# Patient Record
Sex: Male | Born: 1980 | Race: White | Hispanic: No | Marital: Married | Smoking: Never smoker
Health system: Southern US, Community
[De-identification: ages and names within clinical notes are randomized; demographics above are authoritative.]

## PROBLEM LIST (undated history)

## (undated) DIAGNOSIS — I10 Essential (primary) hypertension: Secondary | ICD-10-CM

## (undated) DIAGNOSIS — J45909 Unspecified asthma, uncomplicated: Secondary | ICD-10-CM

## (undated) DIAGNOSIS — F419 Anxiety disorder, unspecified: Secondary | ICD-10-CM

## (undated) DIAGNOSIS — L509 Urticaria, unspecified: Secondary | ICD-10-CM

## (undated) DIAGNOSIS — G35 Multiple sclerosis: Secondary | ICD-10-CM

## (undated) DIAGNOSIS — K219 Gastro-esophageal reflux disease without esophagitis: Secondary | ICD-10-CM

## (undated) DIAGNOSIS — G4730 Sleep apnea, unspecified: Secondary | ICD-10-CM

## (undated) DIAGNOSIS — F32A Depression, unspecified: Secondary | ICD-10-CM

## (undated) HISTORY — DX: Urticaria, unspecified: L50.9

## (undated) HISTORY — PX: OTHER SURGICAL HISTORY: SHX169

## (undated) HISTORY — PX: KNEE SURGERY: SHX244

---

## 2002-02-09 ENCOUNTER — Inpatient Hospital Stay (HOSPITAL_COMMUNITY): Admission: EM | Admit: 2002-02-09 | Discharge: 2002-02-11 | Payer: Self-pay | Admitting: Emergency Medicine

## 2002-02-09 ENCOUNTER — Encounter: Payer: Self-pay | Admitting: General Surgery

## 2002-02-09 ENCOUNTER — Encounter: Payer: Self-pay | Admitting: Emergency Medicine

## 2002-06-05 ENCOUNTER — Emergency Department (HOSPITAL_COMMUNITY): Admission: EM | Admit: 2002-06-05 | Discharge: 2002-06-05 | Payer: Self-pay | Admitting: Emergency Medicine

## 2002-06-05 ENCOUNTER — Encounter: Payer: Self-pay | Admitting: Emergency Medicine

## 2011-04-25 DIAGNOSIS — R059 Cough, unspecified: Secondary | ICD-10-CM | POA: Insufficient documentation

## 2011-04-25 DIAGNOSIS — R0602 Shortness of breath: Secondary | ICD-10-CM | POA: Insufficient documentation

## 2011-04-25 DIAGNOSIS — J3489 Other specified disorders of nose and nasal sinuses: Secondary | ICD-10-CM | POA: Insufficient documentation

## 2011-04-25 DIAGNOSIS — R05 Cough: Secondary | ICD-10-CM | POA: Insufficient documentation

## 2011-04-25 DIAGNOSIS — R071 Chest pain on breathing: Secondary | ICD-10-CM | POA: Insufficient documentation

## 2011-04-25 DIAGNOSIS — Z7982 Long term (current) use of aspirin: Secondary | ICD-10-CM | POA: Insufficient documentation

## 2011-04-25 NOTE — ED Notes (Signed)
Pt c/o pain in left chest, sharp at times, worse with deep breath at times, onset x 1 week, worse recently.

## 2011-04-26 ENCOUNTER — Encounter (HOSPITAL_COMMUNITY): Payer: Self-pay

## 2011-04-26 ENCOUNTER — Other Ambulatory Visit: Payer: Self-pay

## 2011-04-26 ENCOUNTER — Emergency Department (HOSPITAL_COMMUNITY)
Admission: EM | Admit: 2011-04-26 | Discharge: 2011-04-26 | Disposition: A | Discharge status: Home / Self Care | Attending: Emergency Medicine | Admitting: Emergency Medicine

## 2011-04-26 ENCOUNTER — Emergency Department (HOSPITAL_COMMUNITY)

## 2011-04-26 DIAGNOSIS — J069 Acute upper respiratory infection, unspecified: Secondary | ICD-10-CM

## 2011-04-26 DIAGNOSIS — R0789 Other chest pain: Secondary | ICD-10-CM

## 2011-04-26 MED ORDER — IBUPROFEN 800 MG PO TABS
800.0000 mg | ORAL_TABLET | Freq: Once | ORAL | Status: AC
  Administered 2011-04-26: 800 mg via ORAL
  Filled 2011-04-26: qty 1

## 2011-04-26 MED ORDER — ACETAMINOPHEN-CODEINE #3 300-30 MG PO TABS
1.0000 | ORAL_TABLET | Freq: Once | ORAL | Status: AC
  Administered 2011-04-26: 1 via ORAL
  Filled 2011-04-26: qty 1

## 2011-04-26 MED ORDER — IBUPROFEN 800 MG PO TABS: 800.0000 mg | ORAL_TABLET | Freq: Three times a day (TID) | ORAL | Status: AC

## 2011-04-26 MED ORDER — ACETAMINOPHEN-CODEINE 300-60 MG PO TABS: 1.0000 | ORAL_TABLET | Freq: Four times a day (QID) | ORAL | Status: AC | PRN

## 2011-04-26 NOTE — Discharge Instructions (Signed)
Upper Respiratory Infection, Adult  An upper respiratory infection (URI) is also sometimes known as the common cold. The upper respiratory tract includes the nose, sinuses, throat, trachea, and bronchi. Bronchi are the airways leading to the lungs. Most people improve within 1 week, but symptoms can last up to 2 weeks. A residual cough may last even longer.   CAUSES  Many different viruses can infect the tissues lining the upper respiratory tract. The tissues become irritated and inflamed and often become very moist. Mucus production is also common. A cold is contagious. You can easily spread the virus to others by oral contact. This includes kissing, sharing a glass, coughing, or sneezing. Touching your mouth or nose and then touching a surface, which is then touched by another person, can also spread the virus.  SYMPTOMS   Symptoms typically develop 1 to 3 days after you come in contact with a cold virus. Symptoms vary from person to person. They may include:  · Runny nose.  · Sneezing.  · Nasal congestion.  · Sinus irritation.  · Sore throat.  · Loss of voice (laryngitis).  · Cough.  · Fatigue.  · Muscle aches.  · Loss of appetite.  · Headache.  · Low-grade fever.  DIAGNOSIS   You might diagnose your own cold based on familiar symptoms, since most people get a cold 2 to 3 times a year. Your caregiver can confirm this based on your exam. Most importantly, your caregiver can check that your symptoms are not due to another disease such as strep throat, sinusitis, pneumonia, asthma, or epiglottitis. Blood tests, throat tests, and X-rays are not necessary to diagnose a common cold, but they may sometimes be helpful in excluding other more serious diseases. Your caregiver will decide if any further tests are required.  RISKS AND COMPLICATIONS   You may be at risk for a more severe case of the common cold if you smoke cigarettes, have chronic heart disease (such as heart failure) or lung disease (such as asthma), or if  you have a weakened immune system. The very young and very old are also at risk for more serious infections. Bacterial sinusitis, middle ear infections, and bacterial pneumonia can complicate the common cold. The common cold can worsen asthma and chronic obstructive pulmonary disease (COPD). Sometimes, these complications can require emergency medical care and may be life-threatening.  PREVENTION   The best way to protect against getting a cold is to practice good hygiene. Avoid oral or hand contact with people with cold symptoms. Wash your hands often if contact occurs. There is no clear evidence that vitamin C, vitamin E, echinacea, or exercise reduces the chance of developing a cold. However, it is always recommended to get plenty of rest and practice good nutrition.  TREATMENT   Treatment is directed at relieving symptoms. There is no cure. Antibiotics are not effective, because the infection is caused by a virus, not by bacteria. Treatment may include:  · Increased fluid intake. Sports drinks offer valuable electrolytes, sugars, and fluids.  · Breathing heated mist or steam (vaporizer or shower).  · Eating chicken soup or other clear broths, and maintaining good nutrition.  · Getting plenty of rest.  · Using gargles or lozenges for comfort.  · Controlling fevers with ibuprofen or acetaminophen as directed by your caregiver.  · Increasing usage of your inhaler if you have asthma.  Zinc gel and zinc lozenges, taken in the first 24 hours of the common cold, can shorten the   duration and lessen the severity of symptoms. Pain medicines may help with fever, muscle aches, and throat pain. A variety of non-prescription medicines are available to treat congestion and runny nose. Your caregiver can make recommendations and may suggest nasal or lung inhalers for other symptoms.   HOME CARE INSTRUCTIONS   · Only take over-the-counter or prescription medicines for pain, discomfort, or fever as directed by your  caregiver.  · Use a warm mist humidifier or inhale steam from a shower to increase air moisture. This may keep secretions moist and make it easier to breathe.  · Drink enough water and fluids to keep your urine clear or pale yellow.  · Rest as needed.  · Return to work when your temperature has returned to normal or as your caregiver advises. You may need to stay home longer to avoid infecting others. You can also use a face mask and careful hand washing to prevent spread of the virus.  SEEK MEDICAL CARE IF:   · After the first few days, you feel you are getting worse rather than better.  · You need your caregiver's advice about medicines to control symptoms.  · You develop chills, worsening shortness of breath, or brown or red sputum. These may be signs of pneumonia.  · You develop yellow or brown nasal discharge or pain in the face, especially when you bend forward. These may be signs of sinusitis.  · You develop a fever, swollen neck glands, pain with swallowing, or white areas in the back of your throat. These may be signs of strep throat.  SEEK IMMEDIATE MEDICAL CARE IF:   · You have a fever.  · You develop severe or persistent headache, ear pain, sinus pain, or chest pain.  · You develop wheezing, a prolonged cough, cough up blood, or have a change in your usual mucus (if you have chronic lung disease).  · You develop sore muscles or a stiff neck.

## 2011-04-26 NOTE — ED Provider Notes (Signed)
History     CSN: [id number]  Arrival date & time 04/25/11  2341   First MD Initiated Contact with Patient 04/26/11 0008      Chief Complaint  Patient presents with  . Shortness of Breath  . Chest Pain    (Consider location/radiation/quality/duration/timing/severity/associated sxs/prior treatment) The history is provided by the patient.  patient is sick for the last week with cough cold and congestion. Cough is keeping him up at night and he has developed green productive sputum and today with what he believes his tinges of blood stained sputum. Since yesterday he has had left-sided chest pain worse with cough and worse with movement. Pain is sharp in quality and not radiating. No alleviating factors. He is having difficulty breathing with increased nasal congestion. He has been exposed to sick contacts with similar symptoms. No fevers. No nausea vomiting. No abdominal pain. Her leg pain or leg swelling. No medical problems or history of DVT or PE. pain moderate to severe. No history of same.  History reviewed. No pertinent past medical history.  History reviewed. No pertinent past surgical history.  No family history on file.  History  Substance Use Topics  . Smoking status: Not on file  . Smokeless tobacco: Current User  . Alcohol Use: Yes      Review of Systems  Constitutional: Negative for fever and chills.  HENT: Positive for congestion and sinus pressure. Negative for sore throat, neck pain, neck stiffness and voice change.   Eyes: Negative for pain.  Respiratory: Positive for cough.   Cardiovascular: Positive for chest pain. Negative for leg swelling.  Gastrointestinal: Negative for abdominal pain.  Genitourinary: Negative for dysuria.  Musculoskeletal: Negative for back pain.  Skin: Negative for rash.  Neurological: Negative for headaches.  All other systems reviewed and are negative.    Allergies  Penicillins  Home Medications   Current Outpatient Rx    Name Route Sig Dispense Refill  . ASPIRIN 325 MG PO TABS Oral Take 325 mg by mouth daily.      BP 136/57  Pulse 68  Temp(Src) 97.8 F (36.6 C) (Oral)  Resp 18  Ht 6' 3.5" (1.918 m)  Wt 258 lb (117.028 kg)  BMI 31.82 kg/m2  SpO2 100%  Physical Exam  Constitutional: He is oriented to person, place, and time. He appears well-developed and well-nourished.  HENT:  Head: Normocephalic and atraumatic.  Eyes: Conjunctivae and EOM are normal. Pupils are equal, round, and reactive to light.  Neck: Trachea normal. Neck supple. No thyromegaly present.  Cardiovascular: Normal rate, regular rhythm, S1 normal, S2 normal and normal pulses.     No systolic murmur is present   No diastolic murmur is present  Pulses:      Radial pulses are 2+ on the right side, and 2+ on the left side.  Pulmonary/Chest: Effort normal and breath sounds normal. He has no wheezes. He has no rhonchi. He has no rales.       Reproducible left anterior chest wall tenderness. No crepitus. No erythema or fluctuance.  Abdominal: Soft. Normal appearance and bowel sounds are normal. There is no tenderness. There is no CVA tenderness and negative Murphy's sign.  Musculoskeletal:       BLE:s Calves nontender, no cords or erythema, negative Homans sign  Neurological: He is alert and oriented to person, place, and time. He has normal strength. No cranial nerve deficit or sensory deficit. GCS eye subscore is 4. GCS verbal subscore is 5. GCS motor subscore  is 6.  Skin: Skin is warm and dry. No rash noted. He is not diaphoretic.  Psychiatric: His speech is normal.       Cooperative and appropriate    ED Course  Procedures (including critical care time)  Labs Reviewed - No data to display Dg Chest 2 View  04/26/2011  *RADIOLOGY REPORT*  Clinical Data: Cough, congestion, shortness of breath.  CHEST - 2 VIEW  Comparison: None.  Findings: No focal consolidation, pleural effusion, or pneumothorax.  Cardiomediastinal contours are  within normal limits. No acute osseous abnormality.  IMPRESSION: No acute process identified.  Original Report Authenticated By: Waneta Martins, M.D.    Date: 04/26/2011  Rate: 66  Rhythm: normal sinus rhythm  QRS Axis: normal  Intervals: normal  ST/T Wave abnormalities: nonspecific ST changes  Conduction Disutrbances:none  Narrative Interpretation:   Old EKG Reviewed: none available  Motrin. Tylenol with codeine provided   MDM   Clinical presentation suggest upper respiratory infection with associated reproducible anterior chest wall pain, worsened by cough. Chest x-ray reviewed without pneumonia or pneumothorax. Reliable historian verbalizes understanding chest pain and infection precautions. He does not have a local primary care physician and agrees to recheck in 48 hours for any persistent or worsening condition. No clinical PE and no DVT symptoms. Stable for discharge home at this time        Sunnie Nielsen, MD 04/26/11 0140

## 2011-04-29 ENCOUNTER — Emergency Department (HOSPITAL_COMMUNITY)
Admission: EM | Admit: 2011-04-29 | Discharge: 2011-04-29 | Disposition: A | Discharge status: Home / Self Care | Attending: Emergency Medicine | Admitting: Emergency Medicine

## 2011-04-29 ENCOUNTER — Encounter (HOSPITAL_COMMUNITY): Payer: Self-pay | Admitting: *Deleted

## 2011-04-29 ENCOUNTER — Emergency Department (HOSPITAL_COMMUNITY)

## 2011-04-29 DIAGNOSIS — K2970 Gastritis, unspecified, without bleeding: Secondary | ICD-10-CM

## 2011-04-29 DIAGNOSIS — R109 Unspecified abdominal pain: Secondary | ICD-10-CM | POA: Insufficient documentation

## 2011-04-29 LAB — URINALYSIS, ROUTINE W REFLEX MICROSCOPIC
Leukocytes, UA: NEGATIVE
Nitrite: NEGATIVE
Specific Gravity, Urine: 1.015 (ref 1.005–1.030)
Urobilinogen, UA: 0.2 mg/dL (ref 0.0–1.0)
pH: 7.5 (ref 5.0–8.0)

## 2011-04-29 LAB — CBC
HCT: 42.4 % (ref 39.0–52.0)
Platelets: 148 10*3/uL — ABNORMAL LOW (ref 150–400)
RDW: 12.2 % (ref 11.5–15.5)
WBC: 5.4 10*3/uL (ref 4.0–10.5)

## 2011-04-29 LAB — DIFFERENTIAL
Basophils Absolute: 0 10*3/uL (ref 0.0–0.1)
Lymphocytes Relative: 28 % (ref 12–46)
Neutro Abs: 3.1 10*3/uL (ref 1.7–7.7)
Neutrophils Relative %: 56 % (ref 43–77)

## 2011-04-29 LAB — COMPREHENSIVE METABOLIC PANEL
AST: 35 U/L (ref 0–37)
Albumin: 3.9 g/dL (ref 3.5–5.2)
Alkaline Phosphatase: 65 U/L (ref 39–117)
Chloride: 102 mEq/L (ref 96–112)
Creatinine, Ser: 0.91 mg/dL (ref 0.50–1.35)
Potassium: 4.3 mEq/L (ref 3.5–5.1)
Total Bilirubin: 0.5 mg/dL (ref 0.3–1.2)
Total Protein: 7.2 g/dL (ref 6.0–8.3)

## 2011-04-29 MED ORDER — OMEPRAZOLE 20 MG PO CPDR: 20.0000 mg | DELAYED_RELEASE_CAPSULE | Freq: Two times a day (BID) | ORAL | Status: AC

## 2011-04-29 NOTE — ED Notes (Signed)
Epigastric pain, and pain rt flank.  No nausea, no vomiting, no diarrhea.  Recent uti and uri.

## 2011-04-29 NOTE — ED Provider Notes (Signed)
History    Scribed for Troy Lyons, MD, the patient was seen in room [facility]18/[facility]18. This chart was scribed by Katha Cabal.   CSN: [id number]  Arrival date & time 04/29/11  1032   First MD Initiated Contact with Patient 04/29/11 1141      Chief Complaint  Patient presents with  . Flank Pain    (Consider location/radiation/quality/duration/timing/severity/associated sxs/prior Treatment) Pt was seen at 11:45 AM   Patient is a 31 y.o. male presenting with abdominal pain. The history is provided by the patient. No language interpreter was used.  Abdominal Pain The primary symptoms of the illness do not include nausea, vomiting, diarrhea or dysuria. Episode onset: 3 days ago  The problem has not changed since onset. The illness is associated with eating. Additional symptoms associated with the illness include heartburn. Associated symptoms comments: Chills, shortness of breath. Significant associated medical issues do not include GERD.    Patient reports decreased urinary output.  Reports 4 weeks ago he had UTI that resolved after taking antibiotics.  Patient seen in ED on 04/26/11 for chest pain was given prescription pain medication.  Pain medication provides moderate relief of abdominal and flank pain.  No history of chronic health problems.  No abdominal surgical history.     History reviewed. No pertinent past medical history.  Past Surgical History  Procedure Date  . Knee surgery     Family History  Problem Relation Age of Onset  . Heart failure Mother   . Heart failure Father     History  Substance Use Topics  . Smoking status: Never Smoker   . Smokeless tobacco: Current User    Types: Chew  . Alcohol Use: Yes      Review of Systems  Gastrointestinal: Positive for heartburn. Negative for nausea, vomiting and diarrhea.  Genitourinary: Negative for dysuria.  All other systems reviewed and are negative.  Remaining review of systems negative except as noted in the  HPI.    Allergies  Penicillins and Poison sumac extract  Home Medications   Current Outpatient Rx  Name Route Sig Dispense Refill  . ACETAMINOPHEN-CODEINE 300-60 MG PO TABS Oral Take 1 tablet by mouth every 6 (six) hours as needed for pain (Take at bedtime as needed for cough. No driving while medicated). 30 tablet 0  . IBUPROFEN 800 MG PO TABS Oral Take 1 tablet (800 mg total) by mouth 3 (three) times daily. 21 tablet 0    BP 144/74  Pulse 94  Temp(Src) 97.8 F (36.6 C) (Oral)  Resp 18  Ht 6' 3.5" (1.918 m)  Wt 262 lb (118.842 kg)  BMI 32.32 kg/m2  SpO2 97%  Physical Exam  Nursing note and vitals reviewed. Constitutional: He is oriented to person, place, and time. He appears well-developed and well-nourished.  HENT:  Head: Normocephalic and atraumatic.  Eyes: Conjunctivae and EOM are normal.  Neck: Neck supple.  Cardiovascular: Normal rate and regular rhythm.   No murmur heard. Pulmonary/Chest: Effort normal and breath sounds normal. No respiratory distress.  Abdominal: Soft. There is tenderness in the epigastric area. There is no rebound, no guarding and no CVA tenderness.  Musculoskeletal: Normal range of motion. He exhibits no edema.  Neurological: He is alert and oriented to person, place, and time. No sensory deficit. Coordination normal.  Skin: Skin is warm and dry. No rash noted.  Psychiatric: He has a normal mood and affect. His behavior is normal.    ED Course  Procedures (including critical care time)  DIAGNOSTIC STUDIES: Oxygen Saturation is 97% on room air, normal by my interpretation.     COORDINATION OF CARE: 11:51 AM  Physical exam complete.  Will order labs.     LABS / RADIOLOGY:   Labs Reviewed  CBC - Abnormal; Notable for the following:    Platelets 148 (*)    All other components within normal limits  DIFFERENTIAL - Abnormal; Notable for the following:    Eosinophils Relative 7 (*)    All other components within normal limits    COMPREHENSIVE METABOLIC PANEL - Abnormal; Notable for the following:    Glucose, Bld 101 (*)    ALT 65 (*)    All other components within normal limits  URINALYSIS, ROUTINE W REFLEX MICROSCOPIC  LIPASE, BLOOD   US Abdomen Complete  04/29/2011  *RADIOLOGY REPORT*  Clinical Data:  Right upper quadrant pain.  COMPLETE ABDOMINAL ULTRASOUND  Comparison:  None.  Findings:  Gallbladder:  No gallstones, gallbladder wall thickening, or pericholecystic fluid.  Common bile duct:   Within normal limits in caliber.  Liver:  No focal lesion identified.  Within normal limits in parenchymal echogenicity.  IVC:  Appears normal.  Pancreas:  Obscured by overlying bowel gas.  Spleen:  Within normal limits in size and echotexture.  Right Kidney:   Normal in size and parenchymal echogenicity.  No evidence of mass or hydronephrosis.  Left Kidney:  Normal in size and parenchymal echogenicity.  No evidence of mass or hydronephrosis.  Abdominal aorta:  No aneurysm identified.  IMPRESSION: Unremarkable abdominal ultrasound.  Original Report Authenticated By: Cyndie Chime, M.D.         MDM  The Ultrasound does not show a gallstone and the remainder of the workup looks okay.  I will recommend prilosec twice daily for the next two weeks and follow up with his pcp.  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  He has a physician there.       MEDICATIONS GIVEN IN THE E.D. Scheduled Meds:   Continuous Infusions:       IMPRESSION: No diagnosis found.   NEW MEDICATIONS: New Prescriptions   No medications on file      I personally performed the services described in this documentation, which was scribed in my presence. The recorded information has been reviewed and considered.        Troy Lyons, MD 04/29/11 1331

## 2011-04-29 NOTE — ED Notes (Signed)
Pt returned from ultrasound

## 2011-04-29 NOTE — Discharge Instructions (Signed)
Abdominal Pain  Abdominal pain can be caused by many things. Your caregiver decides the seriousness of your pain by an examination and possibly blood tests and X-rays. Many cases can be observed and treated at home. Most abdominal pain is not caused by a disease and will probably improve without treatment. However, in many cases, more time must pass before a clear cause of the pain can be found. Before that point, it may not be known if you need more testing, or if hospitalization or surgery is needed.  HOME CARE INSTRUCTIONS    Do not take laxatives unless directed by your caregiver.   Take pain medicine only as directed by your caregiver.   Only take over-the-counter or prescription medicines for pain, discomfort, or fever as directed by your caregiver.   Try a clear liquid diet (broth, tea, or water) for as long as directed by your caregiver. Slowly move to a bland diet as tolerated.  SEEK IMMEDIATE MEDICAL CARE IF:    The pain does not go away.   You have a fever.   You keep throwing up (vomiting).   The pain is felt only in portions of the abdomen. Pain in the right side could possibly be appendicitis. In an adult, pain in the left lower portion of the abdomen could be colitis or diverticulitis.   You pass bloody or black tarry stools.  MAKE SURE YOU:    Understand these instructions.   Will watch your condition.   Will get help right away if you are not doing well or get worse.  Document Released: 11/11/2004 Document Revised: 01/21/2011 Document Reviewed: 09/20/2007  Exit[employer] Patient Information 2012 [employer]    Gastritis  Gastritis is an inflammation (the body's way of reacting to injury and/or infection) of the stomach. It is often caused by viral or bacterial (germ) infections. It can also be caused by chemicals (including alcohol) and medications. This illness may be associated with generalized malaise (feeling tired, not well), cramps, and fever. The illness may last 2 to 7 days. If  symptoms of gastritis continue, gastroscopy (looking into the stomach with a telescope-like instrument), biopsy (taking tissue samples), and/or blood tests may be necessary to determine the cause. Antibiotics will not affect the illness unless there is a bacterial infection present. One common bacterial cause of gastritis is an organism known as H. Pylori. This can be treated with antibiotics. Other forms of gastritis are caused by too much acid in the stomach. They can be treated with medications such as H2 blockers and antacids. Home treatment is usually all that is needed. Young children will quickly become dehydrated (loss of body fluids) if vomiting and diarrhea are both present. Medications may be given to control nausea. Medications are usually not given for diarrhea unless especially bothersome. Some medications slow the removal of the virus from the gastrointestinal tract. This slows down the healing process.  HOME CARE INSTRUCTIONS  Home care instructions for nausea and vomiting:   For adults: drink small amounts of fluids often. Drink at least 2 quarts a day. Take sips frequently. Do not drink large amounts of fluid at one time. This may worsen the nausea.   Only take over-the-counter or prescription medicines for pain, discomfort, or fever as directed by your caregiver.   Drink clear liquids only. Those are anything you can see through such as water, broth, or soft drinks.   Once you are keeping clear liquids down, you may start full liquids, soups, juices, and ice   cream or sherbet. Slowly add bland (plain, not spicy) foods to your diet.  Home care instructions for diarrhea:   Diarrhea can be caused by bacterial infections or a virus. Your condition should improve with time, rest, fluids, and/or anti-diarrheal medication.   Until your diarrhea is under control, you should drink clear liquids often in small amounts. Clear liquids include: water, broth, jell-o water and weak  tea.  Avoid:   Milk.   Fruits.   Tobacco.   Alcohol.   Extremely hot or cold fluids.   Too much intake of anything at one time.  When your diarrhea stops you may add the following foods, which help the stool to become more formed:   Rice.   Bananas.   Apples without skin.   Dry toast.  Once these foods are tolerated you may add low-fat yogurt and low-fat cottage cheese. They will help to restore the normal bacterial balance in your bowel.  Wash your hands well to avoid spreading bacteria (germ) or virus.  SEEK IMMEDIATE MEDICAL CARE IF:    You are unable to keep fluids down.   Vomiting or diarrhea become persistent (constant).   Abdominal pain develops, increases, or localizes. (Right sided pain can be appendicitis. Left sided pain in adults can be diverticulitis.)   You develop a fever (an oral temperature above 102 F (38.9 C)).   Diarrhea becomes excessive or contains blood or mucus.   You have excessive weakness, dizziness, fainting or extreme thirst.   You are not improving or you are getting worse.   You have any other questions or concerns.  Document Released: 01/26/2001 Document Revised: 01/21/2011 Document Reviewed: 02/01/2005  [employer] Patient Information 2012 [employer]

## 2011-04-29 NOTE — ED Notes (Signed)
Reminded of need for urine specimen, pt verbalized understanding. Awaiting ultrasound

## 2012-08-02 ENCOUNTER — Encounter (HOSPITAL_COMMUNITY): Payer: Self-pay | Admitting: *Deleted

## 2012-08-02 ENCOUNTER — Emergency Department (HOSPITAL_COMMUNITY)

## 2012-08-02 ENCOUNTER — Emergency Department (HOSPITAL_COMMUNITY)
Admission: EM | Admit: 2012-08-02 | Discharge: 2012-08-03 | Disposition: A | Discharge status: Home / Self Care | Attending: Emergency Medicine | Admitting: Emergency Medicine

## 2012-08-02 DIAGNOSIS — Z880 Allergy status to penicillin: Secondary | ICD-10-CM | POA: Insufficient documentation

## 2012-08-02 DIAGNOSIS — Z8669 Personal history of other diseases of the nervous system and sense organs: Secondary | ICD-10-CM | POA: Insufficient documentation

## 2012-08-02 DIAGNOSIS — H9209 Otalgia, unspecified ear: Secondary | ICD-10-CM | POA: Insufficient documentation

## 2012-08-02 DIAGNOSIS — R221 Localized swelling, mass and lump, neck: Secondary | ICD-10-CM | POA: Insufficient documentation

## 2012-08-02 DIAGNOSIS — R51 Headache: Secondary | ICD-10-CM | POA: Insufficient documentation

## 2012-08-02 DIAGNOSIS — Z87820 Personal history of traumatic brain injury: Secondary | ICD-10-CM | POA: Insufficient documentation

## 2012-08-02 DIAGNOSIS — R220 Localized swelling, mass and lump, head: Secondary | ICD-10-CM | POA: Insufficient documentation

## 2012-08-02 DIAGNOSIS — H9203 Otalgia, bilateral: Secondary | ICD-10-CM

## 2012-08-02 DIAGNOSIS — R209 Unspecified disturbances of skin sensation: Secondary | ICD-10-CM | POA: Insufficient documentation

## 2012-08-02 HISTORY — DX: Multiple sclerosis: G35

## 2012-08-02 MED ORDER — OXYCODONE-ACETAMINOPHEN 5-325 MG PO TABS
1.0000 | ORAL_TABLET | Freq: Once | ORAL | Status: AC
  Administered 2012-08-02: 1 via ORAL
  Filled 2012-08-02: qty 1

## 2012-08-02 NOTE — ED Notes (Signed)
bil ear pain, sore throat, throat feels swollen "My whole head feels swollen"
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
## 2012-08-03 NOTE — ED Notes (Signed)
Patient left ambulatory with wife. Patient in good condition for discharge and discharge instructions given.

## 2012-08-30 ENCOUNTER — Emergency Department (HOSPITAL_COMMUNITY)
Admission: EM | Admit: 2012-08-30 | Discharge: 2012-08-31 | Disposition: A | Discharge status: Home / Self Care | Attending: Emergency Medicine | Admitting: Emergency Medicine

## 2012-08-30 ENCOUNTER — Encounter (HOSPITAL_COMMUNITY): Payer: Self-pay

## 2012-08-30 ENCOUNTER — Emergency Department (HOSPITAL_COMMUNITY)

## 2012-08-30 DIAGNOSIS — Z880 Allergy status to penicillin: Secondary | ICD-10-CM | POA: Insufficient documentation

## 2012-08-30 DIAGNOSIS — R110 Nausea: Secondary | ICD-10-CM | POA: Insufficient documentation

## 2012-08-30 DIAGNOSIS — R109 Unspecified abdominal pain: Secondary | ICD-10-CM | POA: Insufficient documentation

## 2012-08-30 DIAGNOSIS — M2550 Pain in unspecified joint: Secondary | ICD-10-CM | POA: Insufficient documentation

## 2012-08-30 DIAGNOSIS — K5900 Constipation, unspecified: Secondary | ICD-10-CM | POA: Insufficient documentation

## 2012-08-30 DIAGNOSIS — R61 Generalized hyperhidrosis: Secondary | ICD-10-CM | POA: Insufficient documentation

## 2012-08-30 DIAGNOSIS — Z8669 Personal history of other diseases of the nervous system and sense organs: Secondary | ICD-10-CM | POA: Insufficient documentation

## 2012-08-30 DIAGNOSIS — E860 Dehydration: Secondary | ICD-10-CM | POA: Insufficient documentation

## 2012-08-30 DIAGNOSIS — R300 Dysuria: Secondary | ICD-10-CM | POA: Insufficient documentation

## 2012-08-30 LAB — CBC WITH DIFFERENTIAL/PLATELET
Basophils Relative: 1 % (ref 0–1)
HCT: 39 % (ref 39.0–52.0)
Hemoglobin: 14 g/dL (ref 13.0–17.0)
Lymphocytes Relative: 40 % (ref 12–46)
Lymphs Abs: 2.6 10*3/uL (ref 0.7–4.0)
MCHC: 35.9 g/dL (ref 30.0–36.0)
Monocytes Absolute: 0.5 10*3/uL (ref 0.1–1.0)
Monocytes Relative: 8 % (ref 3–12)
Neutro Abs: 2.9 10*3/uL (ref 1.7–7.7)
Neutrophils Relative %: 45 % (ref 43–77)
RBC: 4.38 MIL/uL (ref 4.22–5.81)

## 2012-08-30 LAB — URINE MICROSCOPIC-ADD ON

## 2012-08-30 LAB — BASIC METABOLIC PANEL
BUN: 12 mg/dL (ref 6–23)
CO2: 26 mEq/L (ref 19–32)
Chloride: 107 mEq/L (ref 96–112)
Creatinine, Ser: 1.04 mg/dL (ref 0.50–1.35)
GFR calc Af Amer: >90 mL/min (ref >90)
Glucose, Bld: 103 mg/dL — ABNORMAL HIGH (ref 70–99)
Potassium: 3.9 mEq/L (ref 3.5–5.1)

## 2012-08-30 LAB — URINALYSIS, ROUTINE W REFLEX MICROSCOPIC
Glucose, UA: NEGATIVE mg/dL
Ketones, ur: NEGATIVE mg/dL
Leukocytes, UA: NEGATIVE
Nitrite: NEGATIVE
Specific Gravity, Urine: >1.03 — ABNORMAL HIGH (ref 1.005–1.030)
pH: 6 (ref 5.0–8.0)

## 2012-08-30 MED ORDER — OXYCODONE-ACETAMINOPHEN 5-325 MG PO TABS
1.0000 | ORAL_TABLET | Freq: Once | ORAL | Status: AC
  Administered 2012-08-30: 1 via ORAL
  Filled 2012-08-30: qty 1

## 2012-08-30 MED ORDER — LIDOCAINE HCL 2 % EX GEL
CUTANEOUS | Status: AC
  Administered 2012-08-30: 10 via URETHRAL
  Filled 2012-08-30: qty 10

## 2012-08-30 MED ORDER — LIDOCAINE HCL 2 % EX GEL
Freq: Once | CUTANEOUS | Status: AC
  Administered 2012-08-30: 10 via URETHRAL

## 2012-08-30 MED ORDER — ONDANSETRON 8 MG PO TBDP
8.0000 mg | ORAL_TABLET | Freq: Once | ORAL | Status: AC
  Administered 2012-08-30: 8 mg via ORAL
  Filled 2012-08-30: qty 1

## 2012-08-30 NOTE — ED Provider Notes (Addendum)
History    This chart was scribed for Dione Booze, MD by Donne Anon, ED Scribe. This patient was seen in room APA18/APA18 and the patient's care was started at 2308.  CSN: [id number] Arrival date & time 08/30/12  2214  First MD Initiated Contact with Patient 08/30/12 2308     Chief Complaint  Patient presents with  . Urinary Retention  . Flank Pain  . Abdominal Pain    The history is provided by the patient. No language interpreter was used.   HPI Comments: Troy Lyons is a 32 y.o. male with hx of MS, who presents to the Emergency Department complaining of gradual onset, gradually worsening left lower abdominal pain that radiates to his left flank, left hip pain, testicle pain and urinary retention (onset this morning). He has had these symptoms intermittently for the past year, with the last episode beginning 2 weeks ago and becoming worse and constant over the past 4 days. He rates his pain 7/10. Intercourse makes the pain worse, nothing makes the pain worse. He reports associated constipation, nausea, and diaphoresis. He has increased his liquid intake without relief of the urinary retention. He has tried ibuprofen with no relief. He occasionally takes Percocet for pain, but has not had any today. He has spoken with his PCP and MS doctor about these complaints, and they advised him to come to the ED today.   He denies fever, chills, and vomiting. His last MS flare-up was 2 months ago.     Past Medical History  Diagnosis Date  . MS (multiple sclerosis)    Past Surgical History  Procedure Laterality Date  . Knee surgery    . Nerve damage,    . Eye damage     Family History  Problem Relation Age of Onset  . Heart failure Mother   . Heart failure Father    History  Substance Use Topics  . Smoking status: Never Smoker   . Smokeless tobacco: Current User    Types: Chew  . Alcohol Use: No    Review of Systems  Constitutional: Positive for diaphoresis. Negative for  fever and chills.  Gastrointestinal: Positive for nausea, abdominal pain and constipation. Negative for vomiting.  Genitourinary: Positive for flank pain and difficulty urinating.  Musculoskeletal: Positive for arthralgias.  All other systems reviewed and are negative.    Allergies  Penicillins and Poison sumac extract  Home Medications  No current outpatient prescriptions on file.  BP 132/62  Pulse 74  Temp(Src) 98.2 F (36.8 C) (Oral)  Resp 24  Ht 6\' 3"  (1.905 m)  Wt 281 lb (127.461 kg)  BMI 35.12 kg/m2  SpO2 100%  Physical Exam  Nursing note and vitals reviewed. Constitutional: He appears well-developed and well-nourished. No distress.  HENT:  Head: Normocephalic and atraumatic.  Eyes: Conjunctivae are normal.  Neck: Neck supple. No tracheal deviation present.  Cardiovascular: Normal rate.   Pulmonary/Chest: Effort normal. No respiratory distress.  Abdominal: Soft. He exhibits no distension.  No CVA tenderness.    Genitourinary: Testes normal. Right testis shows no mass and no tenderness. Right testis is descended. Left testis shows no mass and no tenderness. Left testis is descended. Uncircumcised.  Bladder not distended.  Musculoskeletal: Normal range of motion.  Neurological: He is alert.  Skin: Skin is warm and dry.  Psychiatric: He has a normal mood and affect. His behavior is normal.    ED Course  Procedures (including critical care time) DIAGNOSTIC STUDIES: Oxygen Saturation is  100% on RA, normal by my interpretation.    COORDINATION OF CARE: 11:14 PM Discussed treatment plan which includes foley, urinalysis, Zofran, Percocet, cbc with differential, basic metabolic panel and abdominal CT scan with pt at bedside and pt agreed to plan.    Results for orders placed during the hospital encounter of 08/30/12  URINALYSIS, ROUTINE W REFLEX MICROSCOPIC      Result Value Range   Color, Urine YELLOW  YELLOW   APPearance CLEAR  CLEAR   Specific Gravity, Urine  >1.030 (*) 1.005 - 1.030   pH 6.0  5.0 - 8.0   Glucose, UA NEGATIVE  NEGATIVE mg/dL   Hgb urine dipstick SMALL (*) NEGATIVE   Bilirubin Urine NEGATIVE  NEGATIVE   Ketones, ur NEGATIVE  NEGATIVE mg/dL   Protein, ur NEGATIVE  NEGATIVE mg/dL   Urobilinogen, UA 0.2  0.0 - 1.0 mg/dL   Nitrite NEGATIVE  NEGATIVE   Leukocytes, UA NEGATIVE  NEGATIVE  CBC WITH DIFFERENTIAL      Result Value Range   WBC 6.5  4.0 - 10.5 K/uL   RBC 4.38  4.22 - 5.81 MIL/uL   Hemoglobin 14.0  13.0 - 17.0 g/dL   HCT 16.1  09.6 - 04.5 %   MCV 89.0  78.0 - 100.0 fL   MCH 32.0  26.0 - 34.0 pg   MCHC 35.9  30.0 - 36.0 g/dL   RDW 40.9  81.1 - 91.4 %   Platelets 170  150 - 400 K/uL   Neutrophils Relative % 45  43 - 77 %   Neutro Abs 2.9  1.7 - 7.7 K/uL   Lymphocytes Relative 40  12 - 46 %   Lymphs Abs 2.6  0.7 - 4.0 K/uL   Monocytes Relative 8  3 - 12 %   Monocytes Absolute 0.5  0.1 - 1.0 K/uL   Eosinophils Relative 7 (*) 0 - 5 %   Eosinophils Absolute 0.5  0.0 - 0.7 K/uL   Basophils Relative 1  0 - 1 %   Basophils Absolute 0.0  0.0 - 0.1 K/uL  BASIC METABOLIC PANEL      Result Value Range   Sodium 140  135 - 145 mEq/L   Potassium 3.9  3.5 - 5.1 mEq/L   Chloride 107  96 - 112 mEq/L   CO2 26  19 - 32 mEq/L   Glucose, Bld 103 (*) 70 - 99 mg/dL   BUN 12  6 - 23 mg/dL   Creatinine, Ser 7.82  0.50 - 1.35 mg/dL   Calcium 9.4  8.4 - 95.6 mg/dL   GFR calc non Af Amer >90  >90 mL/min   GFR calc Af Amer >90  >90 mL/min  URINE MICROSCOPIC-ADD ON      Result Value Range   Squamous Epithelial / LPF RARE  RARE   WBC, UA 0-2  <3 WBC/hpf   RBC / HPF 3-6  <3 RBC/hpf   Bacteria, UA FEW (*) RARE   Urine-Other MUCOUS PRESENT     Ct Abdomen Pelvis Wo Contrast  08/31/2012   *RADIOLOGY REPORT*  Clinical Data: Urinary retention and flank pain.  CT ABDOMEN AND PELVIS WITHOUT CONTRAST  Technique:  Multidetector CT imaging of the abdomen and pelvis was performed following the standard protocol without intravenous contrast.   Comparison: None.  Findings: Lung bases are clear.  No evidence for free air.  Normal appearance of the liver, gallbladder, spleen, stomach, pancreas and adrenal glands.  The urinary bladder is decompressed  with a Foley catheter.  No evidence for kidney stones or hydronephrosis.  No significant free fluid or lymphadenopathy.  No gross abnormality to the prostate or seminal vesicles.  Normal appearance of the appendix.  Small periumbilical hernia containing fat. No acute bony abnormality.  IMPRESSION: Negative for kidney stones or hydronephrosis. No acute abnormalities.   Original Report Authenticated By: Richarda Overlie, M.D.   Images viewed by me.  1. Abdominal pain   2. Dehydration     MDM  Abdominal and flank pain of uncertain cause. He has history of similar pain in the past with no clear diagnosis found. Decreased urine output. Foley catheter been placed by nursing and he only had about 50 mL of hearing in spite of not having gone all day. You'll be sent for CT scan to make sure he does not have ureteral calculi to account for his pain and oliguria. Screening labs have been ordered.  Laboratory workup is unremarkable including normal BUN and creatinine. However, urinalysis does show specific gravity greater than 1.030 indicating he has some degree of dehydration. This is in spite of his claim to have consumed a lot of fluids. He'll be given IV hydration and see if his urine output picks up.  After 1 L of IV fluid, he has put out about 150 mL of urine confirming that his main problem regarding urine output is dehydration and not any of obstruction. Foley catheter is removed and he is discharged. There is no clear explanation for his chronic abdominal pain.  I personally performed the services described in this documentation, which was scribed in my presence. The recorded information has been reviewed and is accurate.      Dione Booze, MD 08/31/12 0128  Dione Booze, MD 08/31/12 [phone]

## 2012-08-30 NOTE — ED Notes (Signed)
Started out in pain in my left hip, lower abdomen, and in my testicles per pt. Unable to urinate per pt. Last time I urinated was this morning per pt.

## 2012-08-30 NOTE — ED Notes (Signed)
Pt c/o lower abd pain, burning with urination, pain in testicle area that has progressively become worse over the past week, has not been able to urinate since this am per pt.

## 2012-08-30 NOTE — ED Notes (Signed)
Spoke with my PCP doctor yesterday and they said if it continued tomorrow that they would send me to the [facility], spoke with my MS doctor and they said to come here.

## 2012-08-31 MED ORDER — OXYCODONE-ACETAMINOPHEN 5-325 MG PO TABS
1.0000 | ORAL_TABLET | Freq: Once | ORAL | Status: AC
  Administered 2012-08-31: 1 via ORAL
  Filled 2012-08-31: qty 1

## 2012-08-31 MED ORDER — SODIUM CHLORIDE 0.9 % IV BOLUS (SEPSIS)
1000.0000 mL | Freq: Once | INTRAVENOUS | Status: AC
  Administered 2012-08-31: 1000 mL via INTRAVENOUS

## 2012-08-31 NOTE — ED Notes (Signed)
Dr Glick at bedside,  

## 2012-08-31 NOTE — ED Notes (Signed)
Pt states that the pain medication did not help, Dr. Preston Fleeting notified, additional orders given

## 2013-02-15 HISTORY — PX: SINOSCOPY: SHX187

## 2013-06-01 ENCOUNTER — Emergency Department (HOSPITAL_COMMUNITY)

## 2013-06-01 ENCOUNTER — Encounter (HOSPITAL_COMMUNITY): Payer: Self-pay | Admitting: Emergency Medicine

## 2013-06-01 ENCOUNTER — Emergency Department (HOSPITAL_COMMUNITY)
Admission: EM | Admit: 2013-06-01 | Discharge: 2013-06-01 | Disposition: A | Discharge status: Home / Self Care | Attending: Emergency Medicine | Admitting: Emergency Medicine

## 2013-06-01 ENCOUNTER — Other Ambulatory Visit: Payer: Self-pay

## 2013-06-01 DIAGNOSIS — Z880 Allergy status to penicillin: Secondary | ICD-10-CM | POA: Insufficient documentation

## 2013-06-01 DIAGNOSIS — J159 Unspecified bacterial pneumonia: Secondary | ICD-10-CM | POA: Insufficient documentation

## 2013-06-01 DIAGNOSIS — J189 Pneumonia, unspecified organism: Secondary | ICD-10-CM

## 2013-06-01 DIAGNOSIS — R1013 Epigastric pain: Secondary | ICD-10-CM | POA: Insufficient documentation

## 2013-06-01 DIAGNOSIS — M549 Dorsalgia, unspecified: Secondary | ICD-10-CM | POA: Insufficient documentation

## 2013-06-01 DIAGNOSIS — Z8669 Personal history of other diseases of the nervous system and sense organs: Secondary | ICD-10-CM | POA: Insufficient documentation

## 2013-06-01 LAB — BASIC METABOLIC PANEL
BUN: 16 mg/dL (ref 6–23)
CO2: 23 mEq/L (ref 19–32)
CREATININE: 0.95 mg/dL (ref 0.50–1.35)
Calcium: 9.8 mg/dL (ref 8.4–10.5)
Chloride: 108 mEq/L (ref 96–112)
Glucose, Bld: 98 mg/dL (ref 70–99)
POTASSIUM: 4.1 meq/L (ref 3.7–5.3)
Sodium: 144 mEq/L (ref 137–147)

## 2013-06-01 LAB — CBC
HEMATOCRIT: 41.6 % (ref 39.0–52.0)
Hemoglobin: 15.2 g/dL (ref 13.0–17.0)
MCH: 32.2 pg (ref 26.0–34.0)
MCHC: 36.5 g/dL — AB (ref 30.0–36.0)
MCV: 88.1 fL (ref 78.0–100.0)
Platelets: 163 10*3/uL (ref 150–400)
RBC: 4.72 MIL/uL (ref 4.22–5.81)
RDW: 12.6 % (ref 11.5–15.5)
WBC: 5 10*3/uL (ref 4.0–10.5)

## 2013-06-01 LAB — PRO B NATRIURETIC PEPTIDE: Pro B Natriuretic peptide (BNP): 14 pg/mL (ref 0–125)

## 2013-06-01 LAB — I-STAT TROPONIN, ED
Troponin i, poc: 0 ng/mL (ref 0.00–0.08)
Troponin i, poc: 0 ng/mL (ref 0.00–0.08)
Troponin i, poc: 0 ng/mL (ref 0.00–0.08)

## 2013-06-01 MED ORDER — HYDROMORPHONE HCL PF 1 MG/ML IJ SOLN
1.0000 mg | Freq: Once | INTRAMUSCULAR | Status: AC
  Administered 2013-06-01: 1 mg via INTRAVENOUS
  Filled 2013-06-01: qty 1

## 2013-06-01 MED ORDER — SODIUM CHLORIDE 0.9 % IV BOLUS (SEPSIS)
250.0000 mL | Freq: Once | INTRAVENOUS | Status: AC
  Administered 2013-06-01: 250 mL via INTRAVENOUS

## 2013-06-01 MED ORDER — LEVOFLOXACIN 750 MG PO TABS: 750.0000 mg | ORAL_TABLET | Freq: Every day | ORAL | Status: DC

## 2013-06-01 MED ORDER — HYDROCODONE-ACETAMINOPHEN 5-325 MG PO TABS: 1.0000 | ORAL_TABLET | Freq: Four times a day (QID) | ORAL | Status: DC | PRN

## 2013-06-01 MED ORDER — SODIUM CHLORIDE 0.9 % IV SOLN
INTRAVENOUS | Status: DC
  Administered 2013-06-01: 20:00:00 via INTRAVENOUS

## 2013-06-01 MED ORDER — IOHEXOL 350 MG/ML SOLN
100.0000 mL | Freq: Once | INTRAVENOUS | Status: AC | PRN
  Administered 2013-06-01: 100 mL via INTRAVENOUS

## 2013-06-01 MED ORDER — LEVOFLOXACIN IN D5W 750 MG/150ML IV SOLN
750.0000 mg | Freq: Once | INTRAVENOUS | Status: AC
  Administered 2013-06-01: 750 mg via INTRAVENOUS
  Filled 2013-06-01: qty 150

## 2013-06-01 MED ORDER — ONDANSETRON HCL 4 MG/2ML IJ SOLN
4.0000 mg | Freq: Once | INTRAMUSCULAR | Status: AC
  Administered 2013-06-01: 4 mg via INTRAVENOUS
  Filled 2013-06-01: qty 2

## 2013-06-01 NOTE — ED Notes (Signed)
Pt transported to Xray. 

## 2013-06-01 NOTE — Discharge Instructions (Signed)
Exam ice as directed. Use pain medicine as needed. [facility]. If the chest discomfort and pain does not resolve on the antibiotics will require additional workup. Return for any new or worse symptoms.

## 2013-06-01 NOTE — ED Provider Notes (Signed)
CSN: [id number]     Arrival date & time 06/01/13  1614 History   First MD Initiated Contact with Patient 06/01/13 1736     Chief Complaint  Patient presents with  . Chest Pain     (Consider location/radiation/quality/duration/timing/severity/associated sxs/prior Treatment) The history is provided by the patient.   33 year old male with six-day history of chest pain. Left-sided chest radiating to shoulder and arm and to left ear. Also goes to the back. Also associated with some epigastric tightness. Also with shortness of breath ongoing for 6 days. Patient was seen at the [facility] clinic 2 days ago had an EKG done showed incomplete right bundle branch block. Was told to go to the hospital at that time however he did not but decided to come in today. Today the pain got worse. Pain is intermittent and will sometimes they'll last for 1 hour. Currently is 8/10 current episode has lasted for 5 minutes. No fevers. Patient has a negative past medical history other than having MS. Patient sitting cardiac history with mom and dad with the myocardial infarction is being prior to age 21. Patient has never smoked. Patient was medically retired from [employer] for the [employer]. He does have [facility] benefits.  Past Medical History  Diagnosis Date  . MS (multiple sclerosis)    Past Surgical History  Procedure Laterality Date  . Knee surgery    . Nerve damage,    . Eye damage     Family History  Problem Relation Age of Onset  . Heart failure Mother   . Heart failure Father    History  Substance Use Topics  . Smoking status: Never Smoker   . Smokeless tobacco: Current User    Types: Chew  . Alcohol Use: No    Review of Systems  Constitutional: Negative for fever.  HENT: Negative for congestion.   Eyes: Negative for redness.  Respiratory: Positive for shortness of breath.   Cardiovascular: Positive for chest pain. Negative for leg swelling.  Gastrointestinal: Positive for abdominal pain. Negative for nausea  and vomiting.  Genitourinary: Negative for dysuria.  Musculoskeletal: Positive for back pain.  Skin: Negative for rash.  Neurological: Negative for headaches.  Hematological: Does not bruise/bleed easily.  Psychiatric/Behavioral: Negative for confusion.      Allergies  Penicillins and Poison sumac extract  Home Medications   Prior to Admission medications   Not on File   BP 139/90  Pulse 72  Resp 16  SpO2 98% Physical Exam  Nursing note and vitals reviewed. Constitutional: He is oriented to person, place, and time. He appears well-developed and well-nourished. No distress.  HENT:  Head: Normocephalic and atraumatic.  Mouth/Throat: Oropharynx is clear and moist.  Eyes: Conjunctivae and EOM are normal. Pupils are equal, round, and reactive to light.  Neck: Normal range of motion.  Cardiovascular: Normal rate, regular rhythm and normal heart sounds.   No murmur heard. Pulmonary/Chest: Effort normal and breath sounds normal.  Abdominal: Soft. Bowel sounds are normal. There is no tenderness.  Musculoskeletal: Normal range of motion. He exhibits no edema.  Neurological: He is alert and oriented to person, place, and time. No cranial nerve deficit. He exhibits normal muscle tone. Coordination normal.  Skin: Skin is warm. No rash noted.    ED Course  Procedures (including critical care time) Labs Review Labs Reviewed  CBC - Abnormal; Notable for the following:    MCHC 36.5 (*)    All other components within normal limits  BASIC METABOLIC PANEL  PRO B NATRIURETIC PEPTIDE  I-STAT TROPOININ, ED  I-STAT TROPOININ, ED  I-STAT TROPOININ, ED   Results for orders placed during the hospital encounter of 06/01/13  CBC      Result Value Ref Range   WBC 5.0  4.0 - 10.5 K/uL   RBC 4.72  4.22 - 5.81 MIL/uL   Hemoglobin 15.2  13.0 - 17.0 g/dL   HCT 16.1  09.6 - 04.5 %   MCV 88.1  78.0 - 100.0 fL   MCH 32.2  26.0 - 34.0 pg   MCHC 36.5 (*) 30.0 - 36.0 g/dL   RDW 40.9  81.1 -  91.4 %   Platelets 163  150 - 400 K/uL  BASIC METABOLIC PANEL      Result Value Ref Range   Sodium 144  137 - 147 mEq/L   Potassium 4.1  3.7 - 5.3 mEq/L   Chloride 108  96 - 112 mEq/L   CO2 23  19 - 32 mEq/L   Glucose, Bld 98  70 - 99 mg/dL   BUN 16  6 - 23 mg/dL   Creatinine, Ser 7.82  0.50 - 1.35 mg/dL   Calcium 9.8  8.4 - 95.6 mg/dL   GFR calc non Af Amer >90  >90 mL/min   GFR calc Af Amer >90  >90 mL/min  PRO B NATRIURETIC PEPTIDE      Result Value Ref Range   Pro B Natriuretic peptide (BNP) 14.0  0 - 125 pg/mL  I-STAT TROPOININ, ED      Result Value Ref Range   Troponin i, poc 0.00  0.00 - 0.08 ng/mL   Comment 3           I-STAT TROPOININ, ED      Result Value Ref Range   Troponin i, poc 0.00  0.00 - 0.08 ng/mL   Comment 3           I-STAT TROPOININ, ED      Result Value Ref Range   Troponin i, poc 0.00  0.00 - 0.08 ng/mL   Comment 3              Imaging Review Dg Chest 2 View  06/01/2013   CLINICAL DATA:  Shortness of breath and left-sided chest pain.  EXAM: CHEST  2 VIEW  COMPARISON:  Chest x-ray 04/26/2011.  FINDINGS: Lung volumes are normal. No consolidative airspace disease. No pleural effusions. No pneumothorax. No pulmonary nodule or mass noted. Pulmonary vasculature and the cardiomediastinal silhouette are within normal limits.  IMPRESSION: 1.  No radiographic evidence of acute cardiopulmonary disease.   Electronically Signed   By: Trudie Reed M.D.   On: 06/01/2013 18:17   Ct Angio Chest Pe W/cm &/or Wo Cm  06/01/2013   CLINICAL DATA:  Chest pain  EXAM: CT ANGIOGRAPHY CHEST WITH CONTRAST  TECHNIQUE: Multidetector CT imaging of the chest was performed using the standard protocol during bolus administration of intravenous contrast. Multiplanar CT image reconstructions and MIPs were obtained to evaluate the vascular anatomy.  CONTRAST:  80 mL OMNIPAQUE IOHEXOL 350 MG/ML SOLN  COMPARISON:  Chest radiograph June 01, 2013.  FINDINGS: There is no demonstrable  pulmonary embolus. There is no thoracic aortic aneurysm or dissection.  There is a small area of infiltrate in the posterior segment of the left upper lobe. There is a small area of consolidation in the superior segment of the right lower lobe. A third small area of infiltrate is noted in the posterior segment  of the right lower lobe. There is mild atelectatic change in the inferior lingula.  There is no appreciable thoracic adenopathy. The pericardium is not thickened.  The visualized upper abdominal structures appear unremarkable. There are no blastic or lytic bone lesions.  Review of the MIP images confirms the above findings.  IMPRESSION: Small areas of infiltrate bilaterally. No demonstrable pulmonary embolus.   Electronically Signed   By: Bretta BangWilliam  Woodruff M.D.   On: 06/01/2013 19:48     EKG Interpretation   Date/Time:  Friday June 01 2013 18:27:06 EDT Ventricular Rate:  75 PR Interval:  145 QRS Duration: 118 QT Interval:  391 QTC Calculation: 437 R Axis:   3 Text Interpretation:  Sinus rhythm Nonspecific intraventricular conduction  delay No significant change since last tracing Confirmed by Yetunde Leis  MD,  Kyani Simkin [phone] on 06/01/2013 6:35:17 PM      Date: 06/01/2013  Rate: 83  Rhythm: normal sinus rhythm  QRS Axis: normal  Intervals: normal  ST/T Wave abnormalities: early repolarization  Conduction Disutrbances:right bundle branch block  Narrative Interpretation:   Old EKG Reviewed: none available Right bundle branch block is incomplete.     MDM   Final diagnoses:  CAP (community acquired pneumonia)    Patient's workup for chest pain included a CT angiogram rule out pulmonary embolus. The CT did show some infiltrative processes in the both sides of the lung. This looks infectious. We'll treat with a trial of antibiotics patient's penicillin allergy so given Levaquin IV here and will continue on Levaquin daily. [facility] if symptoms resolve  then this was most likely the cause of the chest pain. However the patient does have a very strong family history for coronary artery disease. Patient's troponin x2 were negative here he said symptoms for the last 6 days. So it is unlikely that this is unstable angina or an acute cardiac event. EKG did not have any acute changes. Showed an incomplete right bundle branch block. Patient's in no acute distress nontoxic.    Shelda JakesScott W. Edvin Albus, MD 06/01/13 [phone]

## 2013-06-01 NOTE — ED Notes (Signed)
Pt presents to ED with Left side chest pain radiating to his Left shoulder, arm, Left ear, through to his back, epigastric tightness that is associated to SOB x6 days. Pt seen at his PCP 2 days ago and diagnosed with a heart block, pt is unsure what type of block.

## 2015-03-06 ENCOUNTER — Emergency Department (HOSPITAL_COMMUNITY)

## 2015-03-06 ENCOUNTER — Encounter (HOSPITAL_COMMUNITY): Payer: Self-pay | Admitting: Emergency Medicine

## 2015-03-06 ENCOUNTER — Emergency Department (HOSPITAL_COMMUNITY)
Admission: EM | Admit: 2015-03-06 | Discharge: 2015-03-06 | Disposition: A | Discharge status: Home / Self Care | Attending: Emergency Medicine | Admitting: Emergency Medicine

## 2015-03-06 DIAGNOSIS — Z880 Allergy status to penicillin: Secondary | ICD-10-CM | POA: Diagnosis not present

## 2015-03-06 DIAGNOSIS — J40 Bronchitis, not specified as acute or chronic: Secondary | ICD-10-CM

## 2015-03-06 DIAGNOSIS — J209 Acute bronchitis, unspecified: Secondary | ICD-10-CM | POA: Insufficient documentation

## 2015-03-06 DIAGNOSIS — R05 Cough: Secondary | ICD-10-CM | POA: Diagnosis present

## 2015-03-06 DIAGNOSIS — Z7982 Long term (current) use of aspirin: Secondary | ICD-10-CM | POA: Insufficient documentation

## 2015-03-06 DIAGNOSIS — Z791 Long term (current) use of non-steroidal anti-inflammatories (NSAID): Secondary | ICD-10-CM | POA: Diagnosis not present

## 2015-03-06 DIAGNOSIS — G35 Multiple sclerosis: Secondary | ICD-10-CM | POA: Insufficient documentation

## 2015-03-06 DIAGNOSIS — Z79899 Other long term (current) drug therapy: Secondary | ICD-10-CM | POA: Insufficient documentation

## 2015-03-06 LAB — CBC WITH DIFFERENTIAL/PLATELET
BASOS ABS: 0 10*3/uL (ref 0.0–0.1)
BASOS PCT: 0 %
Eosinophils Absolute: 0.5 10*3/uL (ref 0.0–0.7)
Eosinophils Relative: 9 %
HEMATOCRIT: 47.6 % (ref 39.0–52.0)
Hemoglobin: 16.4 g/dL (ref 13.0–17.0)
Lymphocytes Relative: 24 %
Lymphs Abs: 1.3 10*3/uL (ref 0.7–4.0)
MCH: 30.8 pg (ref 26.0–34.0)
MCHC: 34.5 g/dL (ref 30.0–36.0)
MCV: 89.5 fL (ref 78.0–100.0)
Monocytes Absolute: 0.7 10*3/uL (ref 0.1–1.0)
Monocytes Relative: 13 %
NEUTROS ABS: 2.8 10*3/uL (ref 1.7–7.7)
NEUTROS PCT: 54 %
Platelets: 155 10*3/uL (ref 150–400)
RBC: 5.32 MIL/uL (ref 4.22–5.81)
RDW: 12.9 % (ref 11.5–15.5)
WBC: 5.2 10*3/uL (ref 4.0–10.5)

## 2015-03-06 LAB — COMPREHENSIVE METABOLIC PANEL
ALBUMIN: 4.4 g/dL (ref 3.5–5.0)
ALT: 70 U/L — ABNORMAL HIGH (ref 17–63)
AST: 32 U/L (ref 15–41)
Alkaline Phosphatase: 70 U/L (ref 38–126)
Anion gap: 5 (ref 5–15)
BUN: 22 mg/dL — AB (ref 6–20)
CALCIUM: 9.4 mg/dL (ref 8.9–10.3)
CO2: 27 mmol/L (ref 22–32)
Chloride: 110 mmol/L (ref 101–111)
Creatinine, Ser: 1.14 mg/dL (ref 0.61–1.24)
Glucose, Bld: 100 mg/dL — ABNORMAL HIGH (ref 65–99)
POTASSIUM: 4.5 mmol/L (ref 3.5–5.1)
Sodium: 142 mmol/L (ref 135–145)
Total Bilirubin: 0.8 mg/dL (ref 0.3–1.2)
Total Protein: 7.5 g/dL (ref 6.5–8.1)

## 2015-03-06 MED ORDER — IPRATROPIUM-ALBUTEROL 0.5-2.5 (3) MG/3ML IN SOLN
3.0000 mL | Freq: Once | RESPIRATORY_TRACT | Status: AC
  Administered 2015-03-06: 3 mL via RESPIRATORY_TRACT
  Filled 2015-03-06: qty 3

## 2015-03-06 MED ORDER — PREDNISONE 10 MG PO TABS: 20.0000 mg | ORAL_TABLET | Freq: Every day | ORAL | Status: DC

## 2015-03-06 MED ORDER — ALBUTEROL SULFATE (2.5 MG/3ML) 0.083% IN NEBU
2.5000 mg | INHALATION_SOLUTION | Freq: Once | RESPIRATORY_TRACT | Status: AC
  Administered 2015-03-06: 2.5 mg via RESPIRATORY_TRACT
  Filled 2015-03-06: qty 3

## 2015-03-06 MED ORDER — PREDNISONE 50 MG PO TABS
60.0000 mg | ORAL_TABLET | Freq: Once | ORAL | Status: AC
  Administered 2015-03-06: 60 mg via ORAL
  Filled 2015-03-06: qty 1

## 2015-03-06 MED ORDER — AZITHROMYCIN 250 MG PO TABS: ORAL_TABLET | ORAL | Status: DC

## 2015-03-06 MED ORDER — ALBUTEROL SULFATE HFA 108 (90 BASE) MCG/ACT IN AERS: 2.0000 | INHALATION_SPRAY | RESPIRATORY_TRACT | Status: DC | PRN

## 2015-03-06 NOTE — ED Notes (Signed)
Pt states that he went to the doctor for a cough on 02/19/15, was treated with Doxycycline and Guaifenesin-Codeine.  Called his pcp today due to feeling worse.  Was told to come to the ER to rule out pneumonia.  Pt states that he is short of breath with exertion.

## 2015-03-06 NOTE — Discharge Instructions (Signed)
Follow up next week if not improving. °

## 2015-03-06 NOTE — ED Provider Notes (Signed)
CSN: [id number]     Arrival date & time 03/06/15  1636 History   First MD Initiated Contact with Patient 03/06/15 1653     Chief Complaint  Patient presents with  . Cough     (Consider location/radiation/quality/duration/timing/severity/associated sxs/prior Treatment) Patient is a 35 y.o. male presenting with cough. The history is provided by the patient (The patient complains of cough congestion for a number days. He has taken doxycycline without help).  Cough Cough characteristics:  Non-productive Severity:  Moderate Onset quality:  Sudden Timing:  Constant Progression:  Worsening Chronicity:  New Associated symptoms: shortness of breath   Associated symptoms: no chest pain, no eye discharge, no headaches and no rash     Past Medical History  Diagnosis Date  . MS (multiple sclerosis) [facility])    Past Surgical History  Procedure Laterality Date  . Knee surgery    . Nerve damage,    . Eye damage     Family History  Problem Relation Age of Onset  . Heart failure Mother   . Heart failure Father    Social History  Substance Use Topics  . Smoking status: Never Smoker   . Smokeless tobacco: Current User    Types: Chew  . Alcohol Use: No    Review of Systems  Constitutional: Negative for appetite change and fatigue.  HENT: Negative for congestion, ear discharge and sinus pressure.   Eyes: Negative for discharge.  Respiratory: Positive for cough and shortness of breath.   Cardiovascular: Negative for chest pain.  Gastrointestinal: Negative for abdominal pain and diarrhea.  Genitourinary: Negative for frequency and hematuria.  Musculoskeletal: Negative for back pain.  Skin: Negative for rash.  Neurological: Negative for seizures and headaches.  Psychiatric/Behavioral: Negative for hallucinations.      Allergies  Penicillins and Poison sumac extract  Home Medications   Prior to Admission medications   Medication Sig Start Date End Date Taking? Authorizing  Provider  aspirin EC 81 MG tablet Take 81 mg by mouth daily.   Yes Historical Provider, MD  baclofen (LIORESAL) 10 MG tablet Take 10 mg by mouth 3 (three) times daily.   Yes Historical Provider, MD  cholecalciferol (VITAMIN D) 1000 UNITS tablet Take 2,000 Units by mouth daily.   Yes Historical Provider, MD  docusate sodium (COLACE) 100 MG capsule Take 100 mg by mouth 2 (two) times daily.   Yes Historical Provider, MD  gabapentin (NEURONTIN) 600 MG tablet Take 600 mg by mouth 4 (four) times daily - after meals and at bedtime.   Yes Historical Provider, MD  guaiFENesin-codeine (GUAIFENESIN AC) 100-10 MG/5ML syrup Take 5 mLs by mouth every 4 (four) hours as needed for cough.   Yes Historical Provider, MD  oxyCODONE (OXY IR/ROXICODONE) 5 MG immediate release tablet Take 5 mg by mouth every 8 (eight) hours as needed for severe pain.   Yes Historical Provider, MD  pantoprazole (PROTONIX) 40 MG tablet Take 40 mg by mouth daily.   Yes Historical Provider, MD  Teriflunomide (AUBAGIO) 14 MG TABS Take 14 mg by mouth daily.   Yes Historical Provider, MD  topiramate (TOPAMAX) 50 MG tablet Take 50 mg by mouth 2 (two) times daily.   Yes Historical Provider, MD  vitamin C (ASCORBIC ACID) 500 MG tablet Take 500 mg by mouth daily.   Yes Historical Provider, MD  acetaminophen (TYLENOL) 500 MG tablet Take 1,000 mg by mouth every 6 (six) hours as needed for mild pain.    Historical Provider, MD  azithromycin (  ZITHROMAX Z-PAK) 250 MG tablet 2 po day one, then 1 daily x 4 days 03/06/15   Bethann Berkshire, MD  HYDROcodone-acetaminophen (NORCO/VICODIN) 5-325 MG per tablet Take 1-2 tablets by mouth every 6 (six) hours as needed for moderate pain. Patient not taking: Reported on 03/06/2015 06/01/13   Vanetta Mulders, MD  ibuprofen (ADVIL,MOTRIN) 800 MG tablet Take 800 mg by mouth every 8 (eight) hours as needed for moderate pain.    Historical Provider, MD  levofloxacin (LEVAQUIN) 750 MG tablet Take 1 tablet (750 mg total) by mouth  daily. Patient not taking: Reported on 03/06/2015 06/01/13   Vanetta Mulders, MD  polyethylene glycol [facility] Ethelene Hal) packet Take 17 g by mouth daily as needed for mild constipation.    Historical Provider, MD  predniSONE (DELTASONE) 10 MG tablet Take 2 tablets (20 mg total) by mouth daily. 03/06/15   Bethann Berkshire, MD  rizatriptan (MAXALT-MLT) 10 MG disintegrating tablet Take 10 mg by mouth as needed for migraine. May repeat in 2 hours if needed    Historical Provider, MD   BP 149/87 mmHg  Pulse 115  Temp(Src) 98 F (36.7 C) (Temporal)  Resp 18  Ht  (1.905 m)  Wt 280 lb (127.007 kg)  BMI 35.00 kg/m2  SpO2 94% Physical Exam  Constitutional: He is oriented to person, place, and time. He appears well-developed.  HENT:  Head: Normocephalic.  Eyes: Conjunctivae and EOM are normal. No scleral icterus.  Neck: Neck supple. No thyromegaly present.  Cardiovascular: Normal rate and regular rhythm.  Exam reveals no gallop and no friction rub.   No murmur heard. Pulmonary/Chest: No stridor. He has wheezes. He has no rales. He exhibits no tenderness.  Abdominal: He exhibits no distension. There is no tenderness. There is no rebound.  Musculoskeletal: Normal range of motion. He exhibits no edema.  Lymphadenopathy:    He has no cervical adenopathy.  Neurological: He is oriented to person, place, and time. He exhibits normal muscle tone. Coordination normal.  Skin: No rash noted. No erythema.  Psychiatric: He has a normal mood and affect. His behavior is normal.    ED Course  Procedures (including critical care time) Labs Review Labs Reviewed  COMPREHENSIVE METABOLIC PANEL - Abnormal; Notable for the following:    Glucose, Bld 100 (*)    BUN 22 (*)    ALT 70 (*)    All other components within normal limits  CBC WITH DIFFERENTIAL/PLATELET    Imaging Review Dg Chest 2 View  03/06/2015  CLINICAL DATA:  Nonproductive cough.  Shortness of breath. EXAM: CHEST  2 VIEW COMPARISON:   June 01, 2013. FINDINGS: The heart size and mediastinal contours are within normal limits. Both lungs are clear. No pneumothorax or pleural effusion is noted. The visualized skeletal structures are unremarkable. IMPRESSION: No active cardiopulmonary disease. Electronically Signed   By: Lupita Raider, M.D.   On: 03/06/2015 17:39   I have personally reviewed and evaluated these images and lab results as part of my medical decision-making.   EKG Interpretation None      MDM   Final diagnoses:  Bronchitis    Patient with bronchitis and bronchospasm will send home with Z-Pak prednisone and albuterol inhaler will follow-up as needed    Bethann Berkshire, MD 03/06/15 1910

## 2016-06-05 ENCOUNTER — Emergency Department (HOSPITAL_COMMUNITY)

## 2016-06-05 ENCOUNTER — Encounter (HOSPITAL_COMMUNITY): Payer: Self-pay | Admitting: Emergency Medicine

## 2016-06-05 ENCOUNTER — Emergency Department (HOSPITAL_COMMUNITY)
Admission: EM | Admit: 2016-06-05 | Discharge: 2016-06-05 | Disposition: A | Discharge status: Home / Self Care | Attending: Emergency Medicine | Admitting: Emergency Medicine

## 2016-06-05 DIAGNOSIS — F17220 Nicotine dependence, chewing tobacco, uncomplicated: Secondary | ICD-10-CM | POA: Insufficient documentation

## 2016-06-05 DIAGNOSIS — Z79899 Other long term (current) drug therapy: Secondary | ICD-10-CM | POA: Insufficient documentation

## 2016-06-05 DIAGNOSIS — R0981 Nasal congestion: Secondary | ICD-10-CM | POA: Insufficient documentation

## 2016-06-05 DIAGNOSIS — Z7982 Long term (current) use of aspirin: Secondary | ICD-10-CM | POA: Diagnosis not present

## 2016-06-05 DIAGNOSIS — R072 Precordial pain: Secondary | ICD-10-CM | POA: Insufficient documentation

## 2016-06-05 DIAGNOSIS — R0602 Shortness of breath: Secondary | ICD-10-CM | POA: Diagnosis not present

## 2016-06-05 LAB — BASIC METABOLIC PANEL
Anion gap: 5 (ref 5–15)
BUN: 14 mg/dL (ref 6–20)
CALCIUM: 9.3 mg/dL (ref 8.9–10.3)
CHLORIDE: 108 mmol/L (ref 101–111)
CO2: 26 mmol/L (ref 22–32)
CREATININE: 1.08 mg/dL (ref 0.61–1.24)
GFR calc non Af Amer: >60 mL/min (ref >60)
Glucose, Bld: 95 mg/dL (ref 65–99)
Potassium: 4.2 mmol/L (ref 3.5–5.1)
Sodium: 139 mmol/L (ref 135–145)

## 2016-06-05 LAB — CBC
HCT: 44 % (ref 39.0–52.0)
Hemoglobin: 15.6 g/dL (ref 13.0–17.0)
MCH: 32.3 pg (ref 26.0–34.0)
MCHC: 35.5 g/dL (ref 30.0–36.0)
MCV: 91.1 fL (ref 78.0–100.0)
PLATELETS: 154 10*3/uL (ref 150–400)
RBC: 4.83 MIL/uL (ref 4.22–5.81)
RDW: 13.5 % (ref 11.5–15.5)
WBC: 6.7 10*3/uL (ref 4.0–10.5)

## 2016-06-05 LAB — D-DIMER, QUANTITATIVE: D-Dimer, Quant: 1.78 ug/mL-FEU — ABNORMAL HIGH (ref 0.00–0.50)

## 2016-06-05 LAB — TROPONIN I

## 2016-06-05 MED ORDER — SODIUM CHLORIDE 0.9 % IV BOLUS (SEPSIS)
500.0000 mL | Freq: Once | INTRAVENOUS | Status: AC
  Administered 2016-06-05: 500 mL via INTRAVENOUS

## 2016-06-05 MED ORDER — SODIUM CHLORIDE 0.9 % IV SOLN
INTRAVENOUS | Status: DC
  Administered 2016-06-05 (×2): via INTRAVENOUS

## 2016-06-05 MED ORDER — ASPIRIN 81 MG PO CHEW
324.0000 mg | CHEWABLE_TABLET | Freq: Once | ORAL | Status: AC
  Administered 2016-06-05: 324 mg via ORAL
  Filled 2016-06-05: qty 4

## 2016-06-05 MED ORDER — IOPAMIDOL (ISOVUE-370) INJECTION 76%
100.0000 mL | Freq: Once | INTRAVENOUS | Status: AC | PRN
  Administered 2016-06-05: 100 mL via INTRAVENOUS

## 2016-06-05 MED ORDER — NAPROXEN 500 MG PO TABS: 500.0000 mg | ORAL_TABLET | Freq: Two times a day (BID) | ORAL | 0 refills | Status: DC

## 2016-06-05 MED ORDER — ACETAMINOPHEN 500 MG PO TABS
1000.0000 mg | ORAL_TABLET | Freq: Once | ORAL | Status: AC
  Administered 2016-06-05: 1000 mg via ORAL
  Filled 2016-06-05: qty 2

## 2016-06-05 NOTE — ED Notes (Signed)
Call to CT regarding delay- per Ct, pt showing IV discontinued- CT assured that pt has a 20 ga to his L AC in place and is patent

## 2016-06-05 NOTE — ED Notes (Signed)
From CT 

## 2016-06-05 NOTE — ED Notes (Signed)
Visitor to room - awaiting CT results

## 2016-06-05 NOTE — ED Notes (Signed)
Dr Herma Carson in to reassess and inform pt of need for continued assessment and CT

## 2016-06-05 NOTE — ED Triage Notes (Signed)
Pt reports having pain in left rib area since Wednesday.  States slight cough.

## 2016-06-05 NOTE — ED Provider Notes (Signed)
[facility]-EMERGENCY DEPT Provider Note   CSN: [id number] Arrival date & time: 06/05/16  1456     History   Chief Complaint Chief Complaint  Patient presents with  . Chest Pain    HPI Troy Lyons is a 36 y.o. male.  Patient with a complaint of chest pain anterior left lower started on Wednesday intermittent but present more than not only goes away for a few seconds. Not made worse or better by anything. Associated with shortness of breath no nausea or vomiting no diaphoresis. Patient did not have any aspirin today. Pulse ox is been 99%. No fevers a little bit of allergy congestion but no upper respiratory symptoms. Patient does have a history of MS. Patient had similar chest pain in the past and it end up being many acquired pneumonia. Patient denies any leg swelling.      Past Medical History:  Diagnosis Date  . MS (multiple sclerosis) ([facility])     There are no active problems to display for this patient.   Past Surgical History:  Procedure Laterality Date  . eye damage    . KNEE SURGERY    . nerve damage,         Home Medications    Prior to Admission medications   Medication Sig Start Date End Date Taking? Authorizing Provider  acetaminophen (TYLENOL) 500 MG tablet Take 1,000 mg by mouth every 6 (six) hours as needed for mild pain.   Yes Historical Provider, MD  aspirin EC 81 MG tablet Take 81 mg by mouth daily.   Yes Historical Provider, MD  baclofen (LIORESAL) 10 MG tablet Take 10 mg by mouth 3 (three) times daily.   Yes Historical Provider, MD  cholecalciferol (VITAMIN D) 1000 UNITS tablet Take 2,000 Units by mouth daily.   Yes Historical Provider, MD  dexlansoprazole (DEXILANT) 60 MG capsule Take 60 mg by mouth daily. 10/24/12  Yes Historical Provider, MD  docusate sodium (COLACE) 100 MG capsule Take 100 mg by mouth 2 (two) times daily.   Yes Historical Provider, MD  gabapentin (NEURONTIN) 600 MG tablet Take 600 mg by mouth 4 (four) times daily - after meals and  at bedtime.   Yes Historical Provider, MD  guaiFENesin-codeine (GUAIFENESIN AC) 100-10 MG/5ML syrup Take 5 mLs by mouth every 4 (four) hours as needed for cough.   Yes Historical Provider, MD  ibuprofen (ADVIL,MOTRIN) 800 MG tablet Take 800 mg by mouth every 8 (eight) hours as needed for moderate pain.   Yes Historical Provider, MD  montelukast (SINGULAIR) 10 MG tablet Take 10 mg by mouth at bedtime.   Yes Historical Provider, MD  natalizumab (TYSABRI) 300 MG/15ML injection Inject 15 mLs into the vein every 30 (thirty) days.   Yes Historical Provider, MD  oxyCODONE (OXY IR/ROXICODONE) 5 MG immediate release tablet Take 5 mg by mouth every 8 (eight) hours as needed for severe pain.   Yes Historical Provider, MD  polyethylene glycol (MIRALAX / GLYCOLAX) packet Take 17 g by mouth daily as needed for mild constipation.   Yes Historical Provider, MD  rizatriptan (MAXALT-MLT) 10 MG disintegrating tablet Take 10 mg by mouth as needed for migraine. May repeat in 2 hours if needed   Yes Historical Provider, MD  topiramate (TOPAMAX) 50 MG tablet Take 50 mg by mouth 2 (two) times daily.   Yes Historical Provider, MD  vitamin C (ASCORBIC ACID) 500 MG tablet Take 500 mg by mouth daily.   Yes Historical Provider, MD  azithromycin (ZITHROMAX Z-PAK)  250 MG tablet 2 po day one, then 1 daily x 4 days Patient not taking: Reported on 06/05/2016 03/06/15   Bethann Berkshire, MD  HYDROcodone-acetaminophen (NORCO/VICODIN) 5-325 MG per tablet Take 1-2 tablets by mouth every 6 (six) hours as needed for moderate pain. Patient not taking: Reported on 03/06/2015 06/01/13   Vanetta Mulders, MD  levofloxacin (LEVAQUIN) 750 MG tablet Take 1 tablet (750 mg total) by mouth daily. Patient not taking: Reported on 03/06/2015 06/01/13   Vanetta Mulders, MD  naproxen (NAPROSYN) 500 MG tablet Take 1 tablet (500 mg total) by mouth 2 (two) times daily. 06/05/16   Vanetta Mulders, MD  pantoprazole (PROTONIX) 40 MG tablet Take 40 mg by mouth daily.     Historical Provider, MD  predniSONE (DELTASONE) 10 MG tablet Take 2 tablets (20 mg total) by mouth daily. Patient not taking: Reported on 06/05/2016 03/06/15   Bethann Berkshire, MD  Teriflunomide (AUBAGIO) 14 MG TABS Take 14 mg by mouth daily.    Historical Provider, MD    Family History Family History  Problem Relation Age of Onset  . Heart failure Mother   . Heart failure Father     Social History Social History  Substance Use Topics  . Smoking status: Never Smoker  . Smokeless tobacco: Current User    Types: Chew  . Alcohol use No     Allergies   Penicillins and Poison sumac extract   Review of Systems Review of Systems  Constitutional: Negative for fever.  HENT: Positive for congestion.   Eyes: Negative for redness.  Respiratory: Positive for shortness of breath.   Cardiovascular: Positive for chest pain.  Gastrointestinal: Negative for abdominal pain, nausea and vomiting.  Genitourinary: Negative for dysuria.  Musculoskeletal: Negative for back pain.  Skin: Negative for rash.  Neurological: Negative for headaches.  Hematological: Does not bruise/bleed easily.  Psychiatric/Behavioral: Negative for confusion.     Physical Exam Updated Vital Signs BP 118/71 (BP Location: Right Arm)   Pulse (!) 57   Temp 98.1 F (36.7 C) (Oral)   Resp 17   Ht 6\' 3"  (1.905 m)   Wt 126.6 kg   SpO2 100%   BMI 34.87 kg/m   Physical Exam  Constitutional: He appears well-developed and well-nourished. No distress.  Eyes: EOM are normal. Pupils are equal, round, and reactive to light.  Neck: Normal range of motion. Neck supple.  Cardiovascular: Normal rate, regular rhythm and normal heart sounds.   Pulmonary/Chest: Effort normal and breath sounds normal. He exhibits no tenderness.  Abdominal: Soft. Bowel sounds are normal. There is no tenderness.  Musculoskeletal: Normal range of motion. He exhibits no edema.  Neurological: He is alert. No cranial nerve deficit or sensory deficit.  He exhibits normal muscle tone. Coordination normal.  Skin: Skin is warm.  Nursing note and vitals reviewed.    ED Treatments / Results  Labs (all labs ordered are listed, but only abnormal results are displayed) Labs Reviewed  D-DIMER, QUANTITATIVE (NOT AT [facility]) - Abnormal; Notable for the following:       Result Value   D-Dimer, Quant 1.78 (*)    All other components within normal limits  BASIC METABOLIC PANEL  CBC  TROPONIN I   Results for orders placed or performed during the hospital encounter of 06/05/16  Basic metabolic panel  Result Value Ref Range   Sodium 139 135 - 145 mmol/L   Potassium 4.2 3.5 - 5.1 mmol/L   Chloride 108 101 - 111 mmol/L   CO2  26 22 - 32 mmol/L   Glucose, Bld 95 65 - 99 mg/dL   BUN 14 6 - 20 mg/dL   Creatinine, Ser 1.61 0.61 - 1.24 mg/dL   Calcium 9.3 8.9 - 09.6 mg/dL   GFR calc non Af Amer >60 >60 mL/min   GFR calc Af Amer >60 >60 mL/min   Anion gap 5 5 - 15  CBC  Result Value Ref Range   WBC 6.7 4.0 - 10.5 K/uL   RBC 4.83 4.22 - 5.81 MIL/uL   Hemoglobin 15.6 13.0 - 17.0 g/dL   HCT 04.5 40.9 - 81.1 %   MCV 91.1 78.0 - 100.0 fL   MCH 32.3 26.0 - 34.0 pg   MCHC 35.5 30.0 - 36.0 g/dL   RDW 91.4 78.2 - 95.6 %   Platelets 154 150 - 400 K/uL  Troponin I  Result Value Ref Range   Troponin I <0.03 <0.03 ng/mL  D-dimer, quantitative (not at [facility])  Result Value Ref Range   D-Dimer, Quant 1.78 (H) 0.00 - 0.50 ug/mL-FEU   He'll  EKG  EKG Interpretation  Date/Time:  Saturday June 05 2016 15:04:40 EDT Ventricular Rate:  58 PR Interval:    QRS Duration: 116 QT Interval:  398 QTC Calculation: 391 R Axis:   6 Text Interpretation:  Sinus rhythm Incomplete right bundle branch block No significant change since last 2 EKG Confirmed by Paisley Grajeda  MD, Kiarrah Rausch [phone]) on 06/05/2016 3:26:33 PM       Radiology Dg Chest 2 View  Result Date: 06/05/2016 CLINICAL DATA:  Left chest pain and shortness of breath. EXAM: CHEST  2 VIEW COMPARISON:   03/06/2015 FINDINGS: The cardiomediastinal silhouette is within normal limits. The lungs are well inflated and clear. There is no evidence of pleural effusion or pneumothorax. No acute osseous abnormality is identified. IMPRESSION: No active cardiopulmonary disease. Electronically Signed   By: Sebastian Ache M.D.   On: 06/05/2016 16:00   Ct Angio Chest Pe W Or Wo Contrast  Result Date: 06/05/2016 CLINICAL DATA:  Left chest pain radiating to the left arm and shoulder. Shortness breath. Mild nonproductive cough. EXAM: CT ANGIOGRAPHY CHEST WITH CONTRAST TECHNIQUE: Multidetector CT imaging of the chest was performed using the standard protocol during bolus administration of intravenous contrast. Multiplanar CT image reconstructions and MIPs were obtained to evaluate the vascular anatomy. CONTRAST:  100 cc Isovue 370 COMPARISON:  Chest radiographs obtained earlier today. Chest CTA dated 06/01/2013. FINDINGS: Cardiovascular: Satisfactory opacification of the pulmonary arteries to the segmental level. No evidence of pulmonary embolism. Normal heart size. No pericardial effusion. Mediastinum/Nodes: No enlarged mediastinal, hilar, or axillary lymph nodes. Thyroid gland, trachea, and esophagus demonstrate no significant findings. Lungs/Pleura: Interval lingular atelectasis. Otherwise, clear lungs. Upper Abdomen: Unremarkable. Musculoskeletal: Upper thoracic spine degenerative change. Review of the MIP images confirms the above findings. IMPRESSION: Interval inferior lingular atelectasis.  No pulmonary emboli. Electronically Signed   By: Beckie Salts M.D.   On: 06/05/2016 19:03    Procedures Procedures (including critical care time)  Medications Ordered in ED Medications  0.9 %  sodium chloride infusion ( Intravenous New Bag/Given 06/05/16 1730)  aspirin chewable tablet 324 mg (324 mg Oral Given 06/05/16 1558)  sodium chloride 0.9 % bolus 500 mL (500 mLs Intravenous New Bag/Given 06/05/16 1728)  acetaminophen  (TYLENOL) tablet 1,000 mg (1,000 mg Oral Given 06/05/16 1743)  iopamidol (ISOVUE-370) 76 % injection 100 mL (100 mLs Intravenous Contrast Given 06/05/16 1814)     Initial Impression / Assessment  and Plan / ED Course  I have reviewed the triage vital signs and the nursing notes.  Pertinent labs & imaging results that were available during my care of the patient were reviewed by me and considered in my medical decision making (see chart for details).     Outpatient workup for the chest pain without any acute findings. The pains been present for several days. Troponin negative d-dimer was elevated CT angios chest negative for pulmonary embolus. As well as no acute pulmonary findings either. Will treat patient with anti-inflammatories and follow-up with his doctors.  Final Clinical Impressions(s) / ED Diagnoses   Final diagnoses:  Precordial pain    New Prescriptions New Prescriptions   NAPROXEN (NAPROSYN) 500 MG TABLET    Take 1 tablet (500 mg total) by mouth 2 (two) times daily.     Vanetta Mulders, MD 06/05/16 1924

## 2016-06-05 NOTE — ED Notes (Signed)
NS 500 cc hung as bolus as ordered NS 1000 cc at 100 cc/hr hung  Order entered x 2 as computer screen not showing 1st saline in flowsheet

## 2016-06-05 NOTE — Discharge Instructions (Signed)
Workup for the chest pain without any acute findings. Continue Motrin or you can use the Naprosyn prescription provided. Make appointment to follow-up with your doctors. No evidence of any acute cardiac event no evidence of any blood clots in the lungs. No lung abnormalities noted.

## 2016-06-05 NOTE — ED Notes (Signed)
[facility] followed at the [facility]. Diagnosed with MS followed at [facility]- Today reports chest pain for several days with a slight cough  Reprots has previously had this complaint and it was spasms, and one time a pulled muscle

## 2016-06-05 NOTE — ED Notes (Signed)
Pt out of bed to bathroom . 

## 2016-06-05 NOTE — ED Notes (Signed)
Pt going to CT

## 2016-06-05 NOTE — ED Notes (Signed)
Awaiting troponin results

## 2016-06-05 NOTE — ED Notes (Signed)
Pt reported pain 5-6/10. Dr Herma Carson informed

## 2018-02-11 IMAGING — CT CT ANGIO CHEST
2 of 6 series · 19 of 46 positions shown · IV contrast (Isovue)
Comparison: Chest radiographs obtained earlier today. Chest CTA
dated 06/01/2013.

CLINICAL DATA: Left chest pain radiating to the left arm and
shoulder. Shortness breath. Mild nonproductive cough.

EXAM:
CT ANGIOGRAPHY CHEST WITH CONTRAST
TECHNIQUE: Multidetector CT imaging of the chest was performed using the
standard protocol during bolus administration of intravenous
contrast. Multiplanar CT image reconstructions and MIPs were
obtained to evaluate the vascular anatomy.
CONTRAST:  100 cc Isovue 370

[Series 5: thins · axial · 0.98mm/px · z∈[-622,-313]mm · 16 of 339 slices shown]
[im 15/339  lung]
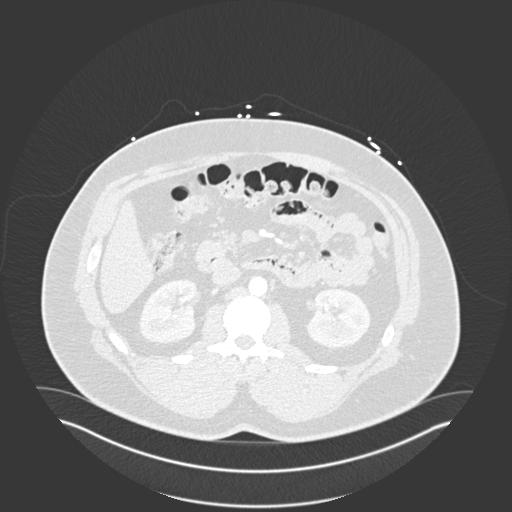
[im 45/339  soft-tissue]
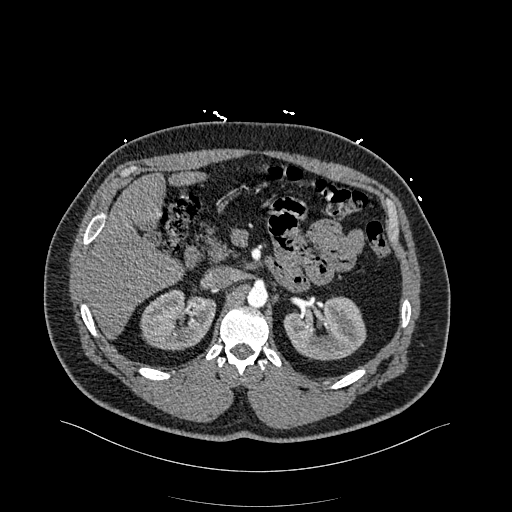
[im 59/339  lung]
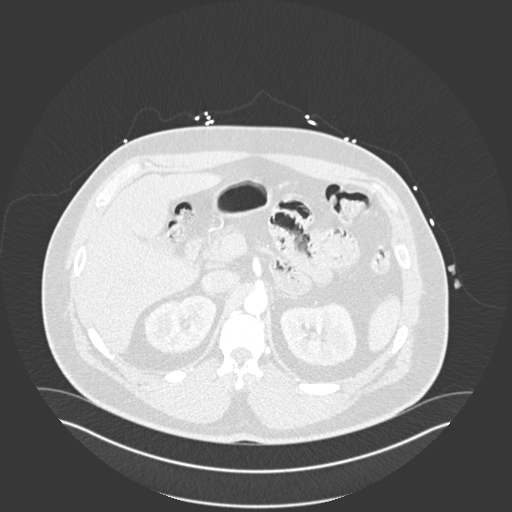
[im 74/339  soft-tissue]
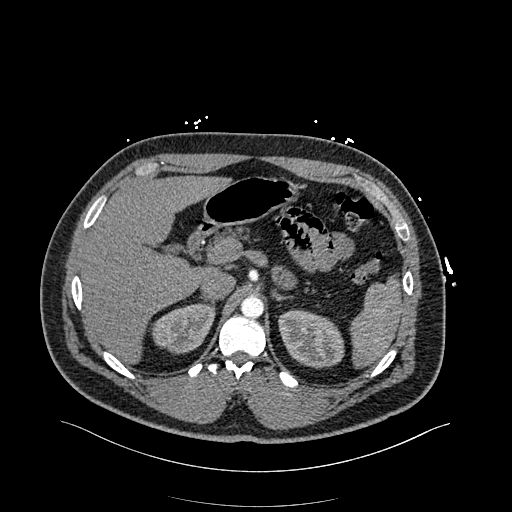
[im 103/339  lung]
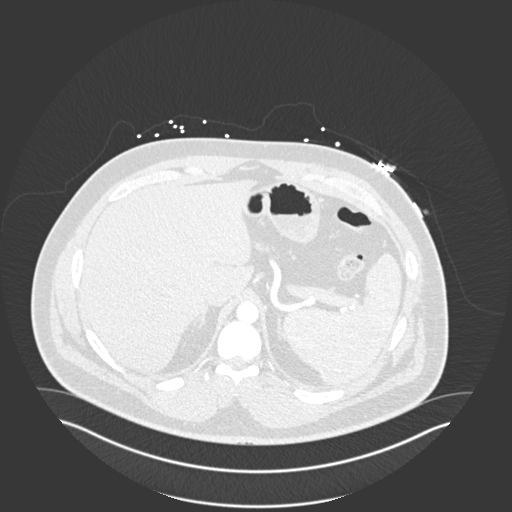
[im 118/339  soft-tissue]
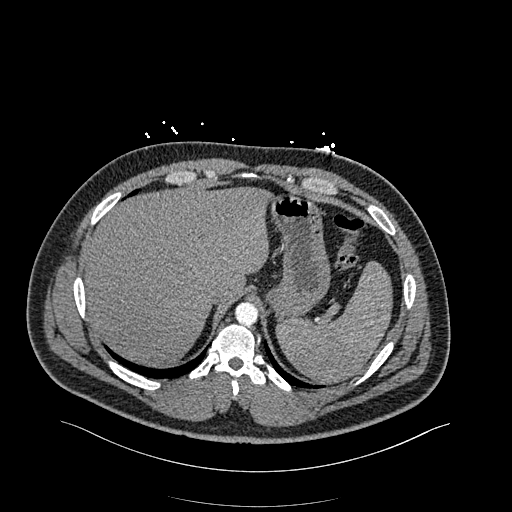
[im 133/339  lung]
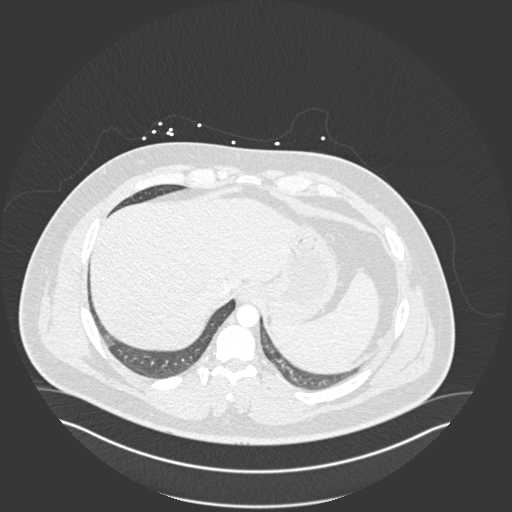
[im 162/339  soft-tissue]
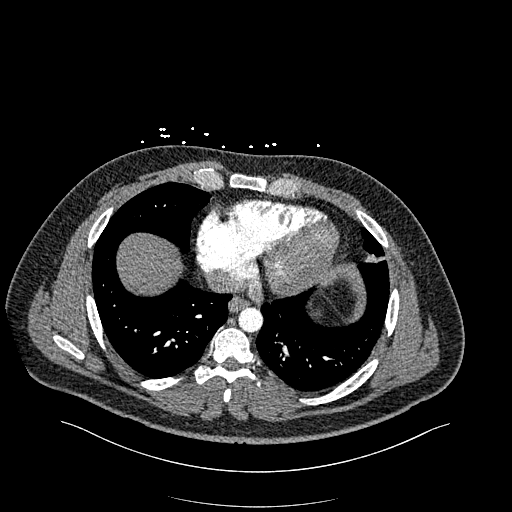
[im 177/339  lung]
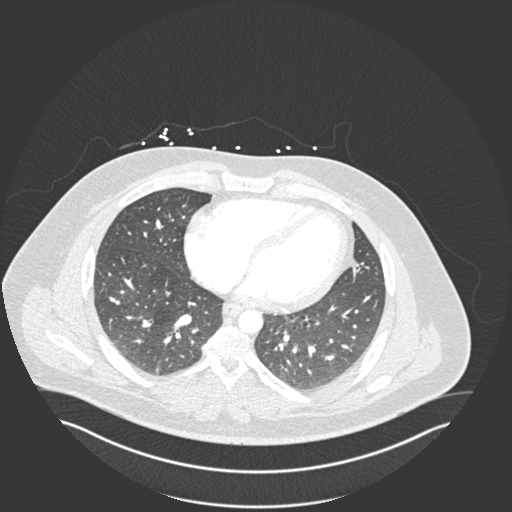
[im 206/339  soft-tissue]
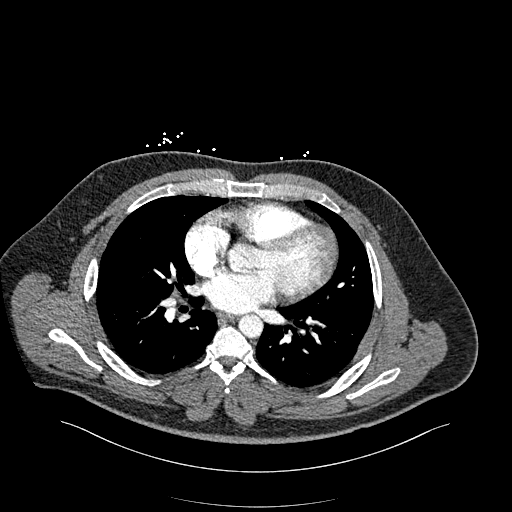
[im 221/339  lung]
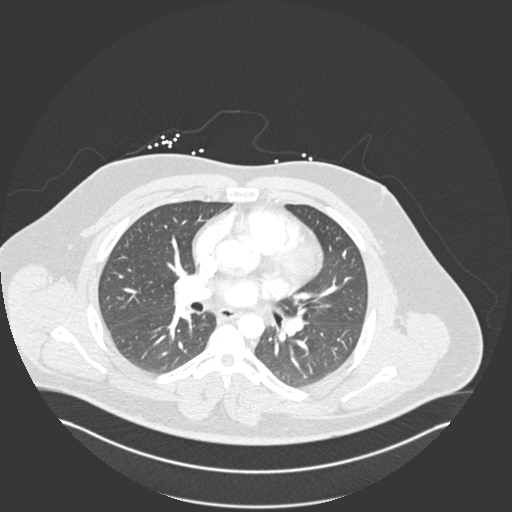
[im 236/339  soft-tissue]
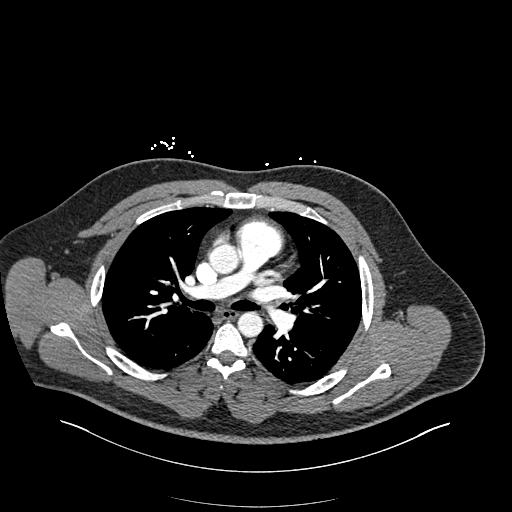
[im 265/339  lung]
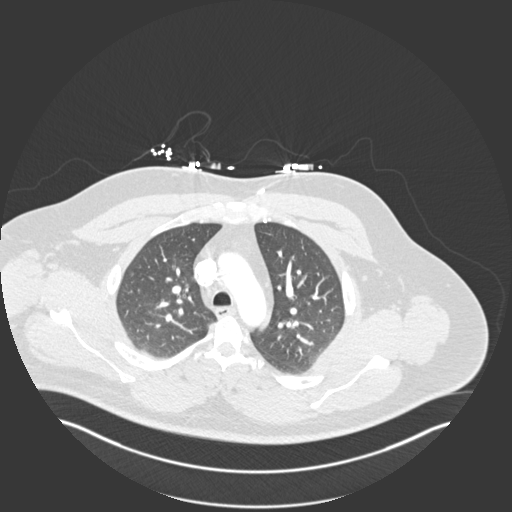
[im 280/339  soft-tissue]
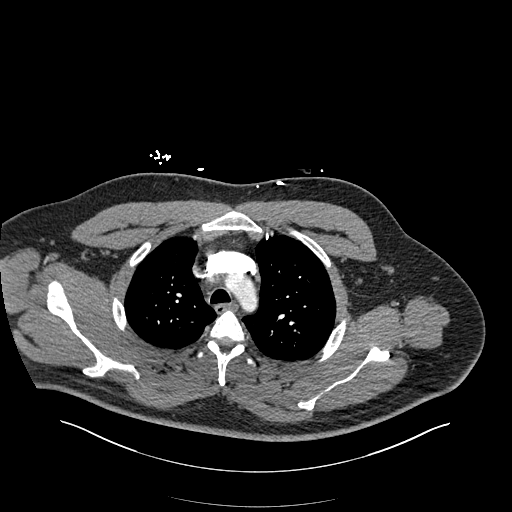
[im 294/339  lung]
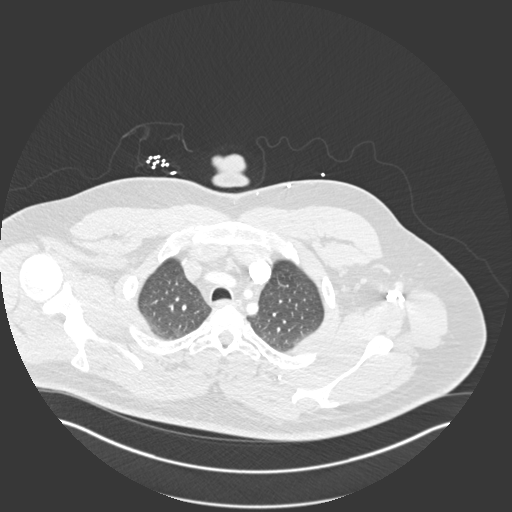
[im 324/339  soft-tissue]
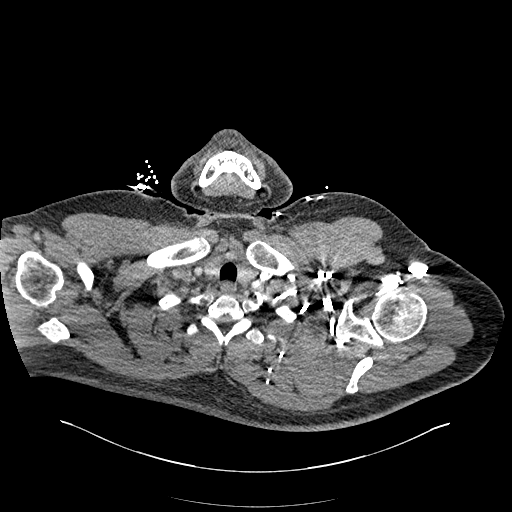

[Series 7: coronal mpr · coronal · 0.70mm/px · 3 of 168 slices shown]
[im 42/168  soft-tissue]
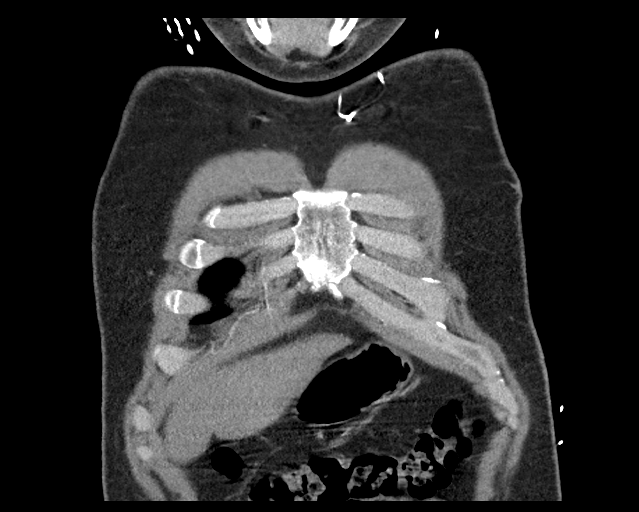
[im 84/168  soft-tissue]
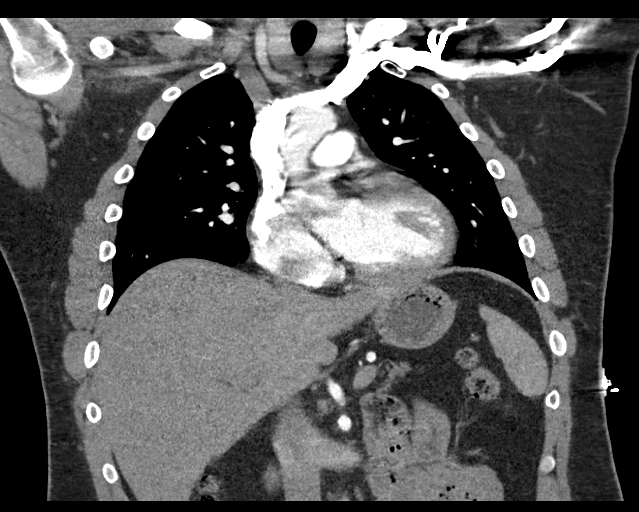
[im 126/168  soft-tissue]
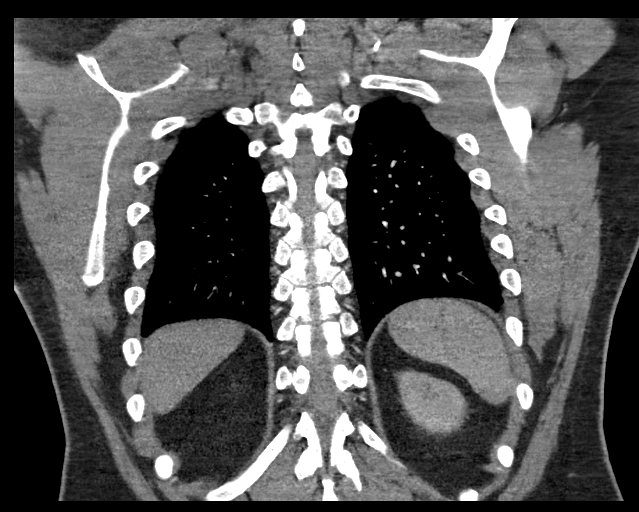

[19 of 46 positions shown; findings below may reference images not displayed]

FINDINGS: Cardiovascular: Satisfactory opacification of the pulmonary arteries
to the segmental level. No evidence of pulmonary embolism. Normal
heart size. No pericardial effusion.

Mediastinum/Nodes: No enlarged mediastinal, hilar, or axillary lymph
nodes. Thyroid gland, trachea, and esophagus demonstrate no
significant findings.

Lungs/Pleura: Interval lingular atelectasis. Otherwise, clear lungs.

Upper Abdomen: Unremarkable.

Musculoskeletal: Upper thoracic spine degenerative change.

Review of the MIP images confirms the above findings.
IMPRESSION: Interval inferior lingular atelectasis.  No pulmonary emboli.

## 2018-12-13 ENCOUNTER — Other Ambulatory Visit: Payer: Self-pay

## 2018-12-13 DIAGNOSIS — Z20822 Contact with and (suspected) exposure to covid-19: Secondary | ICD-10-CM

## 2018-12-14 LAB — NOVEL CORONAVIRUS, NAA: SARS-CoV-2, NAA: NOT DETECTED

## 2018-12-30 ENCOUNTER — Encounter (HOSPITAL_COMMUNITY): Payer: Self-pay | Admitting: Emergency Medicine

## 2018-12-30 ENCOUNTER — Emergency Department (HOSPITAL_COMMUNITY)
Admission: EM | Admit: 2018-12-30 | Discharge: 2018-12-30 | Disposition: A | Discharge status: Home / Self Care | Payer: PRIVATE HEALTH INSURANCE | Source: Home / Self Care | Attending: Emergency Medicine | Admitting: Emergency Medicine

## 2018-12-30 ENCOUNTER — Other Ambulatory Visit: Payer: Self-pay

## 2018-12-30 ENCOUNTER — Emergency Department (HOSPITAL_COMMUNITY): Payer: PRIVATE HEALTH INSURANCE

## 2018-12-30 DIAGNOSIS — J45909 Unspecified asthma, uncomplicated: Secondary | ICD-10-CM | POA: Insufficient documentation

## 2018-12-30 DIAGNOSIS — R0602 Shortness of breath: Secondary | ICD-10-CM | POA: Diagnosis not present

## 2018-12-30 DIAGNOSIS — F17220 Nicotine dependence, chewing tobacco, uncomplicated: Secondary | ICD-10-CM | POA: Insufficient documentation

## 2018-12-30 DIAGNOSIS — Z79899 Other long term (current) drug therapy: Secondary | ICD-10-CM | POA: Insufficient documentation

## 2018-12-30 DIAGNOSIS — U071 COVID-19: Secondary | ICD-10-CM | POA: Insufficient documentation

## 2018-12-30 DIAGNOSIS — R0600 Dyspnea, unspecified: Secondary | ICD-10-CM

## 2018-12-30 DIAGNOSIS — J1282 Pneumonia due to coronavirus disease 2019: Secondary | ICD-10-CM

## 2018-12-30 DIAGNOSIS — J1289 Other viral pneumonia: Secondary | ICD-10-CM | POA: Insufficient documentation

## 2018-12-30 DIAGNOSIS — G35 Multiple sclerosis: Secondary | ICD-10-CM | POA: Insufficient documentation

## 2018-12-30 HISTORY — DX: Unspecified asthma, uncomplicated: J45.909

## 2018-12-30 LAB — COMPREHENSIVE METABOLIC PANEL WITH GFR
ALT: 39 U/L (ref 0–44)
AST: 23 U/L (ref 15–41)
Albumin: 3.9 g/dL (ref 3.5–5.0)
Alkaline Phosphatase: 61 U/L (ref 38–126)
Anion gap: 10 (ref 5–15)
BUN: 14 mg/dL (ref 6–20)
CO2: 22 mmol/L (ref 22–32)
Calcium: 8.5 mg/dL — ABNORMAL LOW (ref 8.9–10.3)
Chloride: 107 mmol/L (ref 98–111)
Creatinine, Ser: 1.03 mg/dL (ref 0.61–1.24)
GFR calc Af Amer: >60 mL/min
GFR calc non Af Amer: >60 mL/min
Glucose, Bld: 120 mg/dL — ABNORMAL HIGH (ref 70–99)
Potassium: 3.6 mmol/L (ref 3.5–5.1)
Sodium: 139 mmol/L (ref 135–145)
Total Bilirubin: 1.1 mg/dL (ref 0.3–1.2)
Total Protein: 7 g/dL (ref 6.5–8.1)

## 2018-12-30 LAB — CBC WITH DIFFERENTIAL/PLATELET
Abs Immature Granulocytes: 0.04 10*3/uL (ref 0.00–0.07)
Basophils Absolute: 0 10*3/uL (ref 0.0–0.1)
Basophils Relative: 0 %
Eosinophils Absolute: 0 10*3/uL (ref 0.0–0.5)
Eosinophils Relative: 0 %
HCT: 40.5 % (ref 39.0–52.0)
Hemoglobin: 13.8 g/dL (ref 13.0–17.0)
Immature Granulocytes: 1 %
Lymphocytes Relative: 23 %
Lymphs Abs: 1.3 10*3/uL (ref 0.7–4.0)
MCH: 31.6 pg (ref 26.0–34.0)
MCHC: 34.1 g/dL (ref 30.0–36.0)
MCV: 92.7 fL (ref 80.0–100.0)
Monocytes Absolute: 0.6 10*3/uL (ref 0.1–1.0)
Monocytes Relative: 10 %
Neutro Abs: 3.7 10*3/uL (ref 1.7–7.7)
Neutrophils Relative %: 66 %
Platelets: 107 10*3/uL — ABNORMAL LOW (ref 150–400)
RBC: 4.37 MIL/uL (ref 4.22–5.81)
RDW: 12.8 % (ref 11.5–15.5)
WBC: 5.7 10*3/uL (ref 4.0–10.5)
nRBC: 0 % (ref 0.0–0.2)

## 2018-12-30 LAB — D-DIMER, QUANTITATIVE: D-Dimer, Quant: 0.32 ug/mL-FEU (ref 0.00–0.50)

## 2018-12-30 MED ORDER — DEXAMETHASONE SODIUM PHOSPHATE 10 MG/ML IJ SOLN
10.0000 mg | Freq: Once | INTRAMUSCULAR | Status: AC
  Administered 2018-12-30: 11:00:00 10 mg via INTRAVENOUS
  Filled 2018-12-30: qty 1

## 2018-12-30 MED ORDER — ALBUTEROL SULFATE HFA 108 (90 BASE) MCG/ACT IN AERS
8.0000 | INHALATION_SPRAY | Freq: Once | RESPIRATORY_TRACT | Status: AC
  Administered 2018-12-30: 8 via RESPIRATORY_TRACT
  Filled 2018-12-30: qty 6.7

## 2018-12-30 NOTE — ED Triage Notes (Signed)
Covid + on Monday, s/s started Sunday.  Increased sob since Sunday.

## 2018-12-30 NOTE — ED Notes (Signed)
Patient ambulated from triage to room 10 in the ED.  Sats started at 98% and were 93% upon reaching the room on room air.

## 2018-12-30 NOTE — Discharge Instructions (Addendum)
See a clinician if you develop persistent worsening shortness of breath.  Follow-up with your doctor as needed.  Continue to isolate from family and others until cough resolved, no longer fevers and at least 14 days from symptom onset.  The steroid you were given last approximately 3 days.  Use your albuterol as needed.

## 2018-12-30 NOTE — ED Provider Notes (Signed)
[facility] EMERGENCY DEPARTMENT Provider Note   CSN: [id number] Arrival date & time: 12/30/18  0901     History   Chief Complaint Chief Complaint  Patient presents with  . Shortness of Breath    HPI Troy Lyons is a 38 y.o. male.     Patient with multiple sclerosis compliant medications and asthma normally controlled history presents with worsening shortness of breath today.  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  Patient has no cardiac or blood clot history.  Recurrent coughing and pain in the ribs with coughing bilateral.     Past Medical History:  Diagnosis Date  . Asthma   . MS (multiple sclerosis) ([place])     There are no active problems to display for this patient.   Past Surgical History:  Procedure Laterality Date  . eye damage    . KNEE SURGERY    . nerve damage,          Home Medications    Prior to Admission medications   Medication Sig Start Date End Date Taking? Authorizing Provider  acetaminophen (TYLENOL) 500 MG tablet Take by mouth every 6 (six) hours as needed for mild pain.    Yes [provider]  baclofen (LIORESAL) 10 MG tablet Take 10 mg by mouth 3 (three) times daily.   Yes [provider]  benzonatate (TESSALON) 200 MG capsule Take 200 mg by mouth 3 (three) times daily as needed. 12/13/18  Yes [provider]  cholecalciferol (VITAMIN D) 1000 UNITS tablet Take 2,000 Units by mouth daily.   Yes [provider]  docusate sodium (COLACE) 100 MG capsule Take 100 mg by mouth daily.    Yes [provider]  fluticasone (FLONASE) 50 MCG/ACT nasal spray Place 1 spray into both nostrils daily.   Yes [provider]  gabapentin (NEURONTIN) 600 MG tablet Take 600 mg by mouth 4 (four) times daily - after meals and at bedtime.   Yes [provider]  hydroxychloroquine (PLAQUENIL) 200 MG tablet Take 200-400 mg by mouth as directed. Take 400mg   on day 1, then take 200mg  on days 2-5 12/25/18  Yes [provider]  levocetirizine (XYZAL) 5 MG tablet Take 5 mg by mouth at bedtime. 12/02/18  Yes [provider]  montelukast (SINGULAIR) 10 MG tablet Take 10 mg by mouth at bedtime.   Yes [provider]  natalizumab (TYSABRI) 300 MG/15ML injection Inject 15 mLs into the vein every 30 (thirty) days.   Yes [provider]  predniSONE (DELTASONE) 10 MG tablet Take 10-50 mg by mouth as directed. Patient takes 50mg  for 3 days, then 40mg  for 3 days, then 30mg  for 3 days, then 20mg  for 3 days, then 10mg  for 3 days, then stop   Yes [provider]  rizatriptan (MAXALT-MLT) 10 MG disintegrating tablet Take 10 mg by mouth as needed for migraine. May repeat in 2 hours if needed   Yes [provider]  topiramate (TOPAMAX) 50 MG tablet Take 50 mg by mouth 2 (two) times daily.   Yes [provider]  vitamin C (ASCORBIC ACID) 500 MG tablet Take 500 mg by mouth daily.   Yes [provider]  zinc sulfate 50 MG CAPS capsule Take 220 mg by mouth daily. 12/25/18  Yes [provider]  azithromycin (ZITHROMAX) 250 MG tablet Take 250-500 mg by mouth as directed. Take 2 tablets (500mg ) on day 1, then take 1 tablet(250mg )  on days 2-5    [provider]    Family History Family History  Problem Relation Age of Onset  . Heart failure Mother   . Heart failure Father     Social History Social History   Tobacco Use  . Smoking status: Never Smoker  . Smokeless tobacco: Current User    Types: Chew  Substance Use Topics  . Alcohol use: No  . Drug use: No     Allergies   Penicillins, Poison sumac extract, Carbamazepine, and Fingolimod   Review of Systems Review of Systems  Constitutional: Negative for chills and fever.  HENT: Negative for congestion.   Eyes: Negative for visual disturbance.  Respiratory: Positive for cough and shortness of breath.   Cardiovascular:  Negative for chest pain.  Gastrointestinal: Negative for abdominal pain and vomiting.  Genitourinary: Negative for dysuria and flank pain.  Musculoskeletal: Negative for back pain, neck pain and neck stiffness.  Skin: Negative for rash.  Neurological: Negative for light-headedness and headaches.     Physical Exam Updated Vital Signs BP 105/66   Pulse 83   Temp 99.5 F (37.5 C) (Oral)   Resp 20   Ht 6\' 3"  (1.905 m)   Wt 112.5 kg   SpO2 96%   BMI 31.00 kg/m   Physical Exam Vitals signs and nursing note reviewed.  Constitutional:      Appearance: He is well-developed.  HENT:     Head: Normocephalic and atraumatic.  Eyes:     General:        Right eye: No discharge.        Left eye: No discharge.     Conjunctiva/sclera: Conjunctivae normal.  Neck:     Musculoskeletal: Normal range of motion and neck supple.     Trachea: No tracheal deviation.  Cardiovascular:     Rate and Rhythm: Normal rate and regular rhythm.  Pulmonary:     Effort: Pulmonary effort is normal.     Breath sounds: Rhonchi (left lower and mid) present.  Abdominal:     General: There is no distension.     Palpations: Abdomen is soft.     Tenderness: There is no abdominal tenderness. There is no guarding.  Musculoskeletal:     Right lower leg: No edema.     Left lower leg: No edema.  Skin:    General: Skin is warm.     Findings: No rash.  Neurological:     Mental Status: He is alert and oriented to person, place, and time.      ED Treatments / Results  Labs (all labs ordered are listed, but only abnormal results are displayed) Labs Reviewed  CBC WITH DIFFERENTIAL/PLATELET - Abnormal; Notable for the following components:      Result Value   Platelets 107 (*)    All other components within normal limits  COMPREHENSIVE METABOLIC PANEL - Abnormal; Notable for the following components:   Glucose, Bld 120 (*)    Calcium 8.5 (*)    All other components within normal limits  D-DIMER,  QUANTITATIVE (NOT AT [facility])    EKG None EKG reviewed heart rate 75, sinus rhythm, normal QT, no acute ST changes. Radiology Dg Chest Portable 1 View  Result Date: 12/30/2018 CLINICAL DATA:  Shortness of breath, COVID positive EXAM: PORTABLE CHEST 1 VIEW COMPARISON:  06/05/2016 FINDINGS: The heart size and mediastinal contours are within normal limits. Subtle bilateral peripheral heterogeneous airspace opacity. The visualized skeletal structures are unremarkable. IMPRESSION: Subtle bilateral peripheral  heterogeneous airspace opacity, generally in keeping with reported diagnosis of COVID-19. Electronically Signed   By: Lauralyn Primes M.D.   On: 12/30/2018 09:59    Procedures Procedures (including critical care time)  Medications Ordered in ED Medications  dexamethasone (DECADRON) injection 10 mg (10 mg Intravenous Given 12/30/18 1045)  albuterol (VENTOLIN HFA) 108 (90 Base) MCG/ACT inhaler 8 puff (8 puffs Inhalation Given 12/30/18 1045)     Initial Impression / Assessment and Plan / ED Course  I have reviewed the triage vital signs and the nursing notes.  Pertinent labs & imaging results that were available during my care of the patient were reviewed by me and considered in my medical decision making (see chart for details).       Patient with known Covid positive presents with worsening shortness of breath and cough.  Patient recurrent cough in the room and some rales in the left side.  Plan for chest x-ray, blood work, D-dimer, albuterol inhaler and further observation.  With ambulating patient 93% on room air. With patient's MS and asthma history more likely benefit from steroids Decadron ordered. Patient observed in the emergency room with normal work of breathing not requiring oxygen.  Chest x-ray no acute findings.  D-dimer negative, remainder blood work unremarkable normal hemoglobin.  Patient stable for outpatient follow-up. Troy Lyons was evaluated in Emergency Department  on 12/30/2018 for the symptoms described in the history of present illness. He was evaluated in the context of the global COVID-19 pandemic, which necessitated consideration that the patient might be at risk for infection with the SARS-CoV-2 virus that causes COVID-19. Institutional protocols and algorithms that pertain to the evaluation of patients at risk for COVID-19 are in a state of rapid change based on information released by regulatory bodies including the CDC and federal and state organizations. These policies and algorithms were followed during the patient's care in the ED.   Final Clinical Impressions(s) / ED Diagnoses   Final diagnoses:  Pneumonia due to COVID-19 virus  Acute dyspnea    ED Discharge Orders    None       Blane Ohara, MD 12/30/18 1148

## 2018-12-30 NOTE — ED Notes (Signed)
Pt expressed understanding of discharge instructions, use of inhaler and follow up. Pt ambulatory at discharge, no distress noted,

## 2019-01-01 ENCOUNTER — Other Ambulatory Visit: Payer: Self-pay

## 2019-01-01 ENCOUNTER — Emergency Department (HOSPITAL_COMMUNITY): Payer: PRIVATE HEALTH INSURANCE

## 2019-01-01 ENCOUNTER — Encounter (HOSPITAL_COMMUNITY): Payer: Self-pay

## 2019-01-01 ENCOUNTER — Inpatient Hospital Stay (HOSPITAL_COMMUNITY)
Admission: EM | Admit: 2019-01-01 | Discharge: 2019-01-05 | DRG: 177 | Disposition: A | Discharge status: Home / Self Care | Payer: PRIVATE HEALTH INSURANCE | Attending: Internal Medicine | Admitting: Internal Medicine

## 2019-01-01 DIAGNOSIS — Z79899 Other long term (current) drug therapy: Secondary | ICD-10-CM

## 2019-01-01 DIAGNOSIS — J45909 Unspecified asthma, uncomplicated: Secondary | ICD-10-CM

## 2019-01-01 DIAGNOSIS — Z880 Allergy status to penicillin: Secondary | ICD-10-CM | POA: Diagnosis not present

## 2019-01-01 DIAGNOSIS — J1289 Other viral pneumonia: Secondary | ICD-10-CM | POA: Diagnosis not present

## 2019-01-01 DIAGNOSIS — Z8249 Family history of ischemic heart disease and other diseases of the circulatory system: Secondary | ICD-10-CM

## 2019-01-01 DIAGNOSIS — J9601 Acute respiratory failure with hypoxia: Secondary | ICD-10-CM | POA: Diagnosis present

## 2019-01-01 DIAGNOSIS — U071 COVID-19: Secondary | ICD-10-CM | POA: Diagnosis present

## 2019-01-01 DIAGNOSIS — R238 Other skin changes: Secondary | ICD-10-CM | POA: Diagnosis present

## 2019-01-01 DIAGNOSIS — Z888 Allergy status to other drugs, medicaments and biological substances status: Secondary | ICD-10-CM

## 2019-01-01 DIAGNOSIS — J1282 Pneumonia due to coronavirus disease 2019: Secondary | ICD-10-CM | POA: Diagnosis present

## 2019-01-01 DIAGNOSIS — G35 Multiple sclerosis: Secondary | ICD-10-CM | POA: Diagnosis present

## 2019-01-01 DIAGNOSIS — F17220 Nicotine dependence, chewing tobacco, uncomplicated: Secondary | ICD-10-CM | POA: Diagnosis present

## 2019-01-01 DIAGNOSIS — E861 Hypovolemia: Secondary | ICD-10-CM | POA: Diagnosis present

## 2019-01-01 DIAGNOSIS — D84821 Immunodeficiency due to drugs: Secondary | ICD-10-CM

## 2019-01-01 DIAGNOSIS — I9589 Other hypotension: Secondary | ICD-10-CM | POA: Diagnosis present

## 2019-01-01 DIAGNOSIS — R110 Nausea: Secondary | ICD-10-CM | POA: Diagnosis present

## 2019-01-01 DIAGNOSIS — R197 Diarrhea, unspecified: Secondary | ICD-10-CM | POA: Diagnosis present

## 2019-01-01 DIAGNOSIS — R0602 Shortness of breath: Secondary | ICD-10-CM

## 2019-01-01 LAB — COMPREHENSIVE METABOLIC PANEL
ALT: 36 U/L (ref 0–44)
AST: 24 U/L (ref 15–41)
Albumin: 4 g/dL (ref 3.5–5.0)
Alkaline Phosphatase: 57 U/L (ref 38–126)
Anion gap: 10 (ref 5–15)
BUN: 18 mg/dL (ref 6–20)
CO2: 23 mmol/L (ref 22–32)
Calcium: 8.4 mg/dL — ABNORMAL LOW (ref 8.9–10.3)
Chloride: 105 mmol/L (ref 98–111)
Creatinine, Ser: 1.37 mg/dL — ABNORMAL HIGH (ref 0.61–1.24)
GFR calc Af Amer: >60 mL/min (ref >60)
GFR calc non Af Amer: >60 mL/min (ref >60)
Glucose, Bld: 96 mg/dL (ref 70–99)
Potassium: 3.3 mmol/L — ABNORMAL LOW (ref 3.5–5.1)
Sodium: 138 mmol/L (ref 135–145)
Total Bilirubin: 1.4 mg/dL — ABNORMAL HIGH (ref 0.3–1.2)
Total Protein: 6.9 g/dL (ref 6.5–8.1)

## 2019-01-01 LAB — CBC
HCT: 41.6 % (ref 39.0–52.0)
Hemoglobin: 14.1 g/dL (ref 13.0–17.0)
MCH: 31.4 pg (ref 26.0–34.0)
MCHC: 33.9 g/dL (ref 30.0–36.0)
MCV: 92.7 fL (ref 80.0–100.0)
Platelets: 114 10*3/uL — ABNORMAL LOW (ref 150–400)
RBC: 4.49 MIL/uL (ref 4.22–5.81)
RDW: 12.8 % (ref 11.5–15.5)
WBC: 5.8 10*3/uL (ref 4.0–10.5)
nRBC: 0 % (ref 0.0–0.2)

## 2019-01-01 LAB — CBC WITH DIFFERENTIAL/PLATELET
Abs Immature Granulocytes: 0.03 10*3/uL (ref 0.00–0.07)
Basophils Absolute: 0 10*3/uL (ref 0.0–0.1)
Basophils Relative: 0 %
Eosinophils Absolute: 0 10*3/uL (ref 0.0–0.5)
Eosinophils Relative: 0 %
HCT: 40.9 % (ref 39.0–52.0)
Hemoglobin: 13.8 g/dL (ref 13.0–17.0)
Immature Granulocytes: 1 %
Lymphocytes Relative: 31 %
Lymphs Abs: 2 10*3/uL (ref 0.7–4.0)
MCH: 31.4 pg (ref 26.0–34.0)
MCHC: 33.7 g/dL (ref 30.0–36.0)
MCV: 93.2 fL (ref 80.0–100.0)
Monocytes Absolute: 0.5 10*3/uL (ref 0.1–1.0)
Monocytes Relative: 8 %
Neutro Abs: 4 10*3/uL (ref 1.7–7.7)
Neutrophils Relative %: 60 %
Platelets: 112 10*3/uL — ABNORMAL LOW (ref 150–400)
RBC: 4.39 MIL/uL (ref 4.22–5.81)
RDW: 12.7 % (ref 11.5–15.5)
WBC: 6.6 10*3/uL (ref 4.0–10.5)
nRBC: 0 % (ref 0.0–0.2)

## 2019-01-01 LAB — LACTIC ACID, PLASMA: Lactic Acid, Venous: 1 mmol/L (ref 0.5–1.9)

## 2019-01-01 LAB — PROCALCITONIN: Procalcitonin: <0.1 ng/mL

## 2019-01-01 LAB — D-DIMER, QUANTITATIVE: D-Dimer, Quant: 0.42 ug/mL-FEU (ref 0.00–0.50)

## 2019-01-01 LAB — LACTATE DEHYDROGENASE: LDH: 206 U/L — ABNORMAL HIGH (ref 98–192)

## 2019-01-01 LAB — TRIGLYCERIDES: Triglycerides: 45 mg/dL (ref <150)

## 2019-01-01 LAB — ABO/RH: ABO/RH(D): O NEG

## 2019-01-01 LAB — CREATININE, SERUM
Creatinine, Ser: 1.11 mg/dL (ref 0.61–1.24)
GFR calc Af Amer: >60 mL/min (ref >60)
GFR calc non Af Amer: >60 mL/min (ref >60)

## 2019-01-01 LAB — FERRITIN: Ferritin: 633 ng/mL — ABNORMAL HIGH (ref 24–336)

## 2019-01-01 LAB — FIBRINOGEN: Fibrinogen: 626 mg/dL — ABNORMAL HIGH (ref 210–475)

## 2019-01-01 LAB — C-REACTIVE PROTEIN: CRP: 8.1 mg/dL — ABNORMAL HIGH (ref <1.0)

## 2019-01-01 MED ORDER — HYDROCOD POLST-CPM POLST ER 10-8 MG/5ML PO SUER
5.0000 mL | Freq: Two times a day (BID) | ORAL | Status: DC | PRN
  Administered 2019-01-03 – 2019-01-04 (×3): 5 mL via ORAL
  Filled 2019-01-01 (×3): qty 5

## 2019-01-01 MED ORDER — ZINC SULFATE 220 (50 ZN) MG PO CAPS
220.0000 mg | ORAL_CAPSULE | Freq: Every day | ORAL | Status: DC
  Administered 2019-01-01 – 2019-01-05 (×5): 220 mg via ORAL
  Filled 2019-01-01 (×5): qty 1

## 2019-01-01 MED ORDER — GABAPENTIN 300 MG PO CAPS
600.0000 mg | ORAL_CAPSULE | Freq: Two times a day (BID) | ORAL | Status: DC
  Administered 2019-01-01 – 2019-01-05 (×8): 600 mg via ORAL
  Filled 2019-01-01 (×8): qty 2

## 2019-01-01 MED ORDER — GUAIFENESIN-DM 100-10 MG/5ML PO SYRP
10.0000 mL | ORAL_SOLUTION | ORAL | Status: DC | PRN
  Administered 2019-01-01 – 2019-01-05 (×5): 10 mL via ORAL
  Filled 2019-01-01 (×5): qty 10

## 2019-01-01 MED ORDER — BACLOFEN 10 MG PO TABS
10.0000 mg | ORAL_TABLET | Freq: Three times a day (TID) | ORAL | Status: DC
  Administered 2019-01-01 – 2019-01-05 (×12): 10 mg via ORAL
  Filled 2019-01-01 (×14): qty 1

## 2019-01-01 MED ORDER — SODIUM CHLORIDE 0.9 % IV SOLN
100.0000 mg | INTRAVENOUS | Status: AC
  Administered 2019-01-02 – 2019-01-05 (×4): 100 mg via INTRAVENOUS
  Filled 2019-01-01 (×4): qty 100

## 2019-01-01 MED ORDER — DOCUSATE SODIUM 100 MG PO CAPS
100.0000 mg | ORAL_CAPSULE | Freq: Every day | ORAL | Status: DC
  Filled 2019-01-01 (×3): qty 1

## 2019-01-01 MED ORDER — ACETAMINOPHEN 500 MG PO TABS
1000.0000 mg | ORAL_TABLET | Freq: Once | ORAL | Status: AC
  Administered 2019-01-01: 1000 mg via ORAL
  Filled 2019-01-01: qty 2

## 2019-01-01 MED ORDER — SODIUM CHLORIDE 0.9 % IV SOLN
INTRAVENOUS | Status: DC
  Administered 2019-01-01: 14:00:00 via INTRAVENOUS

## 2019-01-01 MED ORDER — VITAMIN C 500 MG PO TABS
500.0000 mg | ORAL_TABLET | Freq: Every day | ORAL | Status: DC
  Administered 2019-01-01 – 2019-01-05 (×5): 500 mg via ORAL
  Filled 2019-01-01 (×5): qty 1

## 2019-01-01 MED ORDER — DEXAMETHASONE 6 MG PO TABS: 6.0000 mg | ORAL_TABLET | ORAL | Status: DC

## 2019-01-01 MED ORDER — KETOROLAC TROMETHAMINE 30 MG/ML IJ SOLN
30.0000 mg | Freq: Once | INTRAMUSCULAR | Status: AC
  Administered 2019-01-01: 30 mg via INTRAVENOUS
  Filled 2019-01-01: qty 1

## 2019-01-01 MED ORDER — FLUTICASONE PROPIONATE 50 MCG/ACT NA SUSP
1.0000 | Freq: Every day | NASAL | Status: DC
  Administered 2019-01-01 – 2019-01-05 (×5): 1 via NASAL
  Filled 2019-01-01: qty 16

## 2019-01-01 MED ORDER — VITAMIN D 25 MCG (1000 UNIT) PO TABS
2000.0000 [IU] | ORAL_TABLET | Freq: Every day | ORAL | Status: DC
  Administered 2019-01-01 – 2019-01-05 (×5): 2000 [IU] via ORAL
  Filled 2019-01-01 (×5): qty 2

## 2019-01-01 MED ORDER — GABAPENTIN 300 MG PO CAPS
300.0000 mg | ORAL_CAPSULE | Freq: Two times a day (BID) | ORAL | Status: DC
  Administered 2019-01-01 – 2019-01-04 (×8): 300 mg via ORAL
  Filled 2019-01-01 (×8): qty 1

## 2019-01-01 MED ORDER — ALBUTEROL SULFATE HFA 108 (90 BASE) MCG/ACT IN AERS
2.0000 | INHALATION_SPRAY | Freq: Four times a day (QID) | RESPIRATORY_TRACT | Status: DC
  Administered 2019-01-01 – 2019-01-05 (×15): 2 via RESPIRATORY_TRACT
  Filled 2019-01-01: qty 6.7

## 2019-01-01 MED ORDER — POTASSIUM CHLORIDE CRYS ER 20 MEQ PO TBCR
40.0000 meq | EXTENDED_RELEASE_TABLET | Freq: Once | ORAL | Status: AC
  Administered 2019-01-01: 40 meq via ORAL
  Filled 2019-01-01: qty 2

## 2019-01-01 MED ORDER — SODIUM CHLORIDE 0.9 % IV BOLUS
500.0000 mL | Freq: Once | INTRAVENOUS | Status: AC
  Administered 2019-01-01: 500 mL via INTRAVENOUS

## 2019-01-01 MED ORDER — TOPIRAMATE 25 MG PO TABS
50.0000 mg | ORAL_TABLET | Freq: Two times a day (BID) | ORAL | Status: DC
  Administered 2019-01-01 – 2019-01-05 (×9): 50 mg via ORAL
  Filled 2019-01-01 (×10): qty 2

## 2019-01-01 MED ORDER — MONTELUKAST SODIUM 10 MG PO TABS: 10.0000 mg | ORAL_TABLET | Freq: Every day | ORAL | Status: DC

## 2019-01-01 MED ORDER — DM-GUAIFENESIN ER 30-600 MG PO TB12
1.0000 | ORAL_TABLET | Freq: Two times a day (BID) | ORAL | Status: DC
  Administered 2019-01-01 – 2019-01-05 (×10): 1 via ORAL
  Filled 2019-01-01 (×10): qty 1

## 2019-01-01 MED ORDER — SODIUM CHLORIDE 0.9 % IV SOLN
200.0000 mg | Freq: Once | INTRAVENOUS | Status: AC
  Administered 2019-01-01: 200 mg via INTRAVENOUS
  Filled 2019-01-01: qty 40

## 2019-01-01 MED ORDER — GABAPENTIN 600 MG PO TABS: 600.0000 mg | ORAL_TABLET | Freq: Three times a day (TID) | ORAL | Status: DC

## 2019-01-01 MED ORDER — ACETAMINOPHEN 325 MG PO TABS
650.0000 mg | ORAL_TABLET | Freq: Four times a day (QID) | ORAL | Status: DC | PRN
  Administered 2019-01-01 – 2019-01-02 (×2): 650 mg via ORAL
  Filled 2019-01-01 (×2): qty 2

## 2019-01-01 MED ORDER — ALBUTEROL SULFATE HFA 108 (90 BASE) MCG/ACT IN AERS
8.0000 | INHALATION_SPRAY | Freq: Once | RESPIRATORY_TRACT | Status: AC
  Administered 2019-01-01: 8 via RESPIRATORY_TRACT
  Filled 2019-01-01: qty 6.7

## 2019-01-01 MED ORDER — ONDANSETRON HCL 4 MG PO TABS: 4.0000 mg | ORAL_TABLET | Freq: Four times a day (QID) | ORAL | Status: DC | PRN

## 2019-01-01 MED ORDER — ONDANSETRON HCL 4 MG/2ML IJ SOLN: 4.0000 mg | Freq: Four times a day (QID) | INTRAMUSCULAR | Status: DC | PRN

## 2019-01-01 MED ORDER — DEXAMETHASONE SODIUM PHOSPHATE 10 MG/ML IJ SOLN
6.0000 mg | INTRAMUSCULAR | Status: DC
  Administered 2019-01-01: 6 mg via INTRAVENOUS
  Filled 2019-01-01: qty 1

## 2019-01-01 MED ORDER — MONTELUKAST SODIUM 10 MG PO TABS
10.0000 mg | ORAL_TABLET | Freq: Every morning | ORAL | Status: DC
  Administered 2019-01-01 – 2019-01-05 (×5): 10 mg via ORAL
  Filled 2019-01-01 (×5): qty 1

## 2019-01-01 MED ORDER — ENOXAPARIN SODIUM 40 MG/0.4ML ~~LOC~~ SOLN
40.0000 mg | SUBCUTANEOUS | Status: DC
  Administered 2019-01-01 – 2019-01-04 (×4): 40 mg via SUBCUTANEOUS
  Filled 2019-01-01 (×4): qty 0.4

## 2019-01-01 NOTE — ED Provider Notes (Signed)
[facility] EMERGENCY DEPARTMENT Provider Note   CSN: [id number] Arrival date & time: 01/01/19  0141   Time seen 3:35 AM  History   Chief Complaint Chief Complaint  Patient presents with   Shortness of Breath    HPI Troy Lyons is a 38 y.o. male.     HPI patient states he started feeling bad on November 8 and on November 9 he tested positive for Covid.  He has finished a Z-Pak and hydroxychloroquine and zinc.  He states he was already on vitamin C and vitamin D because he has MS.  He still has 2 days left of his prednisone.  Patient is on Tysabri and thinks his last injection was in the first part of November.  Patient was seen in the ED 2 days ago for shortness of breath and did have bilateral airspace opacities consistent with his history of COVID-19.  He states that shortness of breath has gotten worse, he states he is constantly short of breath, he feels like he cannot get a big deep breath.  He complains of lower chest pain bilaterally and pain in his back that is worse when he coughs.  His fever today was up to 103.8 and he does describe chills.  He denies sore throat or rhinorrhea.  He states his sputum was initially brown and now is bloody.  He has had nausea the past 2 days and diarrhea about 1 time a day.  He also complains of a frontal headache especially when he coughs.  PCP Troy DapperMann, Benjamin L, PA-C   Past Medical History:  Diagnosis Date   Asthma    MS (multiple sclerosis) ([facility])     There are no active problems to display for this patient.   Past Surgical History:  Procedure Laterality Date   eye damage     KNEE SURGERY     nerve damage,          Home Medications    Prior to Admission medications   Medication Sig Start Date End Date Taking? Authorizing Provider  acetaminophen (TYLENOL) 500 MG tablet Take by mouth every 6 (six) hours as needed for mild pain.     [provider]  azithromycin (ZITHROMAX) 250 MG tablet Take 250-500 mg by  mouth as directed. Take 2 tablets (500mg ) on day 1, then take 1 tablet(250mg ) on days 2-5    [provider]  baclofen (LIORESAL) 10 MG tablet Take 10 mg by mouth 3 (three) times daily.    [provider]  benzonatate (TESSALON) 200 MG capsule Take 200 mg by mouth 3 (three) times daily as needed. 12/13/18   [provider]  cholecalciferol (VITAMIN D) 1000 UNITS tablet Take 2,000 Units by mouth daily.    [provider]  docusate sodium (COLACE) 100 MG capsule Take 100 mg by mouth daily.     [provider]  fluticasone (FLONASE) 50 MCG/ACT nasal spray Place 1 spray into both nostrils daily.    [provider]  gabapentin (NEURONTIN) 600 MG tablet Take 600 mg by mouth 4 (four) times daily - after meals and at bedtime.    [provider]  hydroxychloroquine (PLAQUENIL) 200 MG tablet Take 200-400 mg by mouth as directed. Take 400mg  on day 1, then take 200mg  on days 2-5 12/25/18   [provider]  levocetirizine (XYZAL) 5 MG tablet Take 5 mg by mouth at bedtime. 12/02/18   [provider]  montelukast (SINGULAIR) 10 MG tablet Take 10 mg  by mouth at bedtime.    [provider]  natalizumab (TYSABRI) 300 MG/15ML injection Inject 15 mLs into the vein every 30 (thirty) days.    [provider]  predniSONE (DELTASONE) 10 MG tablet Take 10-50 mg by mouth as directed. Patient takes 50mg  for 3 days, then 40mg  for 3 days, then 30mg  for 3 days, then 20mg  for 3 days, then 10mg  for 3 days, then stop    [provider]  rizatriptan (MAXALT-MLT) 10 MG disintegrating tablet Take 10 mg by mouth as needed for migraine. May repeat in 2 hours if needed    [provider]  topiramate (TOPAMAX) 50 MG tablet Take 50 mg by mouth 2 (two) times daily.    [provider]  vitamin C (ASCORBIC ACID) 500 MG tablet Take 500 mg by mouth daily.    [provider]  zinc sulfate 50 MG CAPS capsule Take 220  mg by mouth daily. 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    Physical Exam Updated Vital Signs BP (!) 114/51    Pulse 75    Temp (!) 101.6 F (38.7 C) (Oral)    Resp 20    Ht 6\' 3"  (1.905 m)    Wt 112.5 kg    SpO2 97%    BMI 31.00 kg/m   Physical Exam Vitals signs and nursing note reviewed.  Constitutional:      General: He is in acute distress.     Appearance: Normal appearance. He is normal weight.  HENT:     Head: Normocephalic and atraumatic.     Right Ear: External ear normal.     Left Ear: External ear normal.     Nose: Nose normal.     Mouth/Throat:     Mouth: Mucous membranes are dry.  Eyes:     Extraocular Movements: Extraocular movements intact.     Conjunctiva/sclera: Conjunctivae normal.     Pupils: Pupils are equal, round, and reactive to light.  Neck:     Musculoskeletal: Normal range of motion.  Cardiovascular:     Rate and Rhythm: Normal rate and regular rhythm.  Pulmonary:     Effort: Tachypnea, accessory muscle usage and respiratory distress present.     Breath sounds: Decreased breath sounds present. No wheezing, rhonchi or rales.  Musculoskeletal: Normal range of motion.  Skin:    General: Skin is warm and dry.  Neurological:     General: No focal deficit present.     Mental Status: He is alert and oriented to person, place, and time.     Cranial Nerves: No cranial nerve deficit.  Psychiatric:        Mood and Affect: Mood normal.         Behavior: Behavior normal.        Thought Content: Thought content normal.      ED Treatments / Results  Labs (all  labs ordered are listed, but only abnormal results are displayed) Results for orders placed or performed during the hospital encounter of 01/01/19  Blood Culture (routine x 2)   Specimen: Right Antecubital; Blood  Result Value Ref Range   Specimen Description RIGHT ANTECUBITAL    Special Requests      BOTTLES DRAWN AEROBIC AND ANAEROBIC Blood Culture adequate volume Performed at [facility], [address] [place], [place] [address]    Culture PENDING    Report Status PENDING   Blood Culture (routine x 2)   Specimen: Left Antecubital; Blood  Result Value Ref Range   Specimen Description LEFT ANTECUBITAL    Special Requests      BOTTLES DRAWN AEROBIC AND ANAEROBIC Blood Culture adequate volume Performed at [facility], [address], [place], [place] [address]    Culture PENDING    Report Status PENDING   Lactic acid, plasma  Result Value Ref Range   Lactic Acid, Venous 1.0 0.5 - 1.9 mmol/Lyons  CBC WITH DIFFERENTIAL  Result Value Ref Range   WBC 6.6 4.0 - 10.5 K/uL   RBC 4.39 4.22 - 5.81 MIL/uL   Hemoglobin 13.8 13.0 - 17.0 g/dL   HCT 56.8 12.7 - 51.7 %   MCV 93.2 80.0 - 100.0 fL   MCH 31.4 26.0 - 34.0 pg   MCHC 33.7 30.0 - 36.0 g/dL   RDW 00.1 74.9 - 44.9 %   Platelets 112 (Lyons) 150 - 400 K/uL   nRBC 0.0 0.0 - 0.2 %   Neutrophils Relative % 60 %   Neutro Abs 4.0 1.7 - 7.7 K/uL   Lymphocytes Relative 31 %   Lymphs Abs 2.0 0.7 - 4.0 K/uL   Monocytes Relative 8 %   Monocytes Absolute 0.5 0.1 - 1.0 K/uL   Eosinophils Relative 0 %   Eosinophils Absolute 0.0 0.0 - 0.5 K/uL   Basophils Relative 0 %   Basophils Absolute 0.0 0.0 - 0.1 K/uL   Immature Granulocytes 1 %   Abs Immature Granulocytes 0.03 0.00 - 0.07 K/uL  Comprehensive metabolic panel  Result Value Ref Range   Sodium 138 135 - 145 mmol/Lyons   Potassium 3.3 (Lyons) 3.5 - 5.1 mmol/Lyons   Chloride 105 98 -  111 mmol/Lyons   CO2 23 22 - 32 mmol/Lyons   Glucose, Bld 96 70 - 99 mg/dL   BUN 18 6 - 20 mg/dL   Creatinine, Ser 6.75 (H) 0.61 - 1.24 mg/dL   Calcium 8.4 (Lyons) 8.9 - 10.3 mg/dL   Total Protein 6.9 6.5 - 8.1 g/dL   Albumin 4.0 3.5 - 5.0 g/dL   AST 24 15 - 41 U/Lyons   ALT 36 0 - 44 U/Lyons   Alkaline Phosphatase 57 38 - 126 U/Lyons   Total Bilirubin 1.4 (H) 0.3 - 1.2 mg/dL   GFR calc non Af Amer >60 >60 mL/min   GFR calc Af Amer >60 >60 mL/min   Anion gap 10 5 - 15  D-dimer, quantitative  Result Value Ref Range   D-Dimer, Quant 0.42 0.00 - 0.50 ug/mL-FEU  Procalcitonin  Result Value Ref Range   Procalcitonin <0.10 ng/mL  Lactate dehydrogenase  Result Value Ref Range   LDH 206 (H) 98 - 192 U/Lyons  Ferritin  Result Value Ref Range   Ferritin 633 (H) 24 - 336 ng/mL  Triglycerides  Result Value Ref Range   Triglycerides 45 <150 mg/dL  Fibrinogen  Result Value Ref Range   Fibrinogen 626 (H) 210 -  475 mg/dL  C-reactive protein  Result Value Ref Range   CRP 8.1 (H) <1.0 mg/dL   Laboratory interpretation all normal except elevation of some of his inflammatory markers, D-dimer is normal, mild hypokalemia    EKG EKG Interpretation  Date/Time:  Monday January 01 2019 02:43:23 EST Ventricular Rate:  74 PR Interval:    QRS Duration: 110 QT Interval:  360 QTC Calculation: 400 R Axis:   5 Text Interpretation: Sinus rhythm RSR' in V1 or V2, right VCD or RVH ST elev, probable normal early repol pattern No significant change since last tracing 30 Dec 2018 Confirmed by Rolland Porter [phone]) on 01/01/2019 3:18:22 AM   Radiology Dg Chest Port 1 View  Result Date: 01/01/2019 CLINICAL DATA:  COVID, shortness of breath EXAM: PORTABLE CHEST 1 VIEW COMPARISON:  12/30/2018 FINDINGS: Heart is normal size. Vague patchy opacities in the lungs again noted, unchanged. No effusions. No acute bony abnormality. IMPRESSION: Vague patchy opacities within the lungs concerning for pneumonia. Electronically Signed   By:  Rolm Baptise M.D.   On: 01/01/2019 03:25   Dg Chest Portable 1 View  Result Date: 12/30/2018 CLINICAL DATA:  Shortness of breath, COVID positive EXAM: PORTABLE CHEST 1 VIEW COMPARISON:  06/05/2016 FINDINGS: The heart size and mediastinal contours are within normal limits. Subtle bilateral peripheral heterogeneous airspace opacity. The visualized skeletal structures are unremarkable. IMPRESSION: Subtle bilateral peripheral heterogeneous airspace opacity, generally in keeping with reported diagnosis of COVID-19. Electronically Signed   By: Eddie Candle M.D.   On: 12/30/2018 09:59    Procedures .Critical Care Performed by: Rolland Porter, MD Authorized by: Rolland Porter, MD   Critical care provider statement:    Critical care time (minutes):  32   Critical care was necessary to treat or prevent imminent or life-threatening deterioration of the following conditions:  Circulatory failure, sepsis and respiratory failure   Critical care was time spent personally by me on the following activities:  Discussions with consultants, examination of patient, obtaining history from patient or surrogate, ordering and review of laboratory studies, ordering and review of radiographic studies, pulse oximetry and re-evaluation of patient's condition   (including critical care time)  Medications Ordered in ED Medications  dextromethorphan-guaiFENesin (MUCINEX DM) 30-600 MG per 12 hr tablet 1 tablet (1 tablet Oral Given 01/01/19 0430)  sodium chloride 0.9 % bolus 500 mL (500 mLs Intravenous New Bag/Given 01/01/19 0303)  potassium chloride SA (KLOR-CON) CR tablet 40 mEq (40 mEq Oral Given 01/01/19 0430)  albuterol (VENTOLIN HFA) 108 (90 Base) MCG/ACT inhaler 8 puff (8 puffs Inhalation Given 01/01/19 0431)  ketorolac (TORADOL) 30 MG/ML injection 30 mg (30 mg Intravenous Given 01/01/19 0431)  sodium chloride 0.9 % bolus 500 mL (500 mLs Intravenous New Bag/Given 01/01/19 0431)  acetaminophen (TYLENOL) tablet 1,000 mg  (1,000 mg Oral Given 01/01/19 0430)     Initial Impression / Assessment and Plan / ED Course  I have reviewed the triage vital signs and the nursing notes.  Pertinent labs & imaging results that were available during my care of the patient were reviewed by me and considered in my medical decision making (see chart for details).      Patient was hypotensive, he was given 500 cc boluses of IV fluids.  He was given Mucinex DM for his cough and he was given 8 puffs of albuterol inhaler to see if that would help with his shortness of breath.  He was given IV Toradol for his chest pain and headache.  He  also was given acetaminophen for his fever.  My concern is patient is on immune suppressants and he appears tachypneic at rest.  He is not hypoxic however I am going to discuss him with the hospitalist to see if he meets criteria for admission.  Patient discussed with Dr. Sharl MaLama of 4:27 AM he is going to admit patient.  Troy Lyons was evaluated in Emergency Department on 01/01/2019 for the symptoms described in the history of present illness. He was evaluated in the context of the global COVID-19 pandemic, which necessitated consideration that the patient might be at risk for infection with the SARS-CoV-2 virus that causes COVID-19. Institutional protocols and algorithms that pertain to the evaluation of patients at risk for COVID-19 are in a state of rapid change based on information released by regulatory bodies including the CDC and federal and state organizations. These policies and algorithms were followed during the patient's care in the ED.   Final Clinical Impressions(s) / ED Diagnoses   Final diagnoses:  COVID-19  Shortness of breath  Hypotension due to hypovolemia  Immunosuppression due to drug therapy    Plan admission  Devoria AlbeIva Haniya Fern, MD, [facility] PyoFACEP    Cathlene Gardella, MD 01/01/19 [id number]

## 2019-01-01 NOTE — ED Notes (Addendum)
Covid test printout in chart under media files  And paper copy in bin to be scaned and filed

## 2019-01-01 NOTE — ED Notes (Signed)
BP in left arm higher than BP in right arm- Dr Tomi Bamberger made aware.

## 2019-01-01 NOTE — H&P (Signed)
History and Physical    Troy Lyons QIO:[id number] DOB: 1980-04-30 DOA: 01/01/2019  PCP: Shawnie Dapper, PA-C  Patient coming from: Home  I have personally briefly reviewed patient's old medical records in [facility]  Chief Complaint: Shortness of breath and cough  HPI: Troy Lyons is a 38 y.o. male with medical history significant of asthma and multiple sclerosis, began having shortness of breath, cough and fever approximately 1 week ago.  He tested positive for COVID-19 on 11/9 (results of test are in the media section of chart).  He had seen his primary care physician and was prescribed a course of hydroxychloroquine, azithromycin and prednisone.  Despite taking these medications, his symptoms have persisted.  He continued to have fevers, cough and shortness of breath.  He came to the emergency room on 11/14, received a dose of Decadron and albuterol treatment.  He felt better and discharged home.  Since returning home, he has had progressive symptoms and has come back to the emergency room today with worsening shortness of breath.  He is currently on room air with oxygen saturations are 95%.  Upon ambulation in the room, oxygen saturations are noted to drop down to 91 to 92%.  Chest x-ray performed in the emergency room concerning for developing pneumonia.  Inflammatory markers elevated including CRP and ferritin.  He has been referred for admission.  Review of Systems:  General: Positive for fever, malaise, generalized weakness Chest: Positive for cough, shortness of breath Cardiac: Negative for chest pain, palpitations GI: Negative for nausea, vomiting, diarrhea All other systems reviewed and found to be negative   Past Medical History:  Diagnosis Date  . Asthma   . MS (multiple sclerosis) (HCC)     Past Surgical History:  Procedure Laterality Date  . eye damage    . KNEE SURGERY    . nerve damage,      Social History:  reports that he has never smoked.  His smokeless tobacco use includes chew. He reports that he does not drink alcohol or use drugs.  Allergies  Allergen Reactions  . Penicillins Anaphylaxis    Has patient had a PCN reaction causing immediate rash, facial/tongue/throat swelling, SOB or lightheadedness with hypotension: Yes Has patient had a PCN reaction causing severe rash involving mucus membranes or skin necrosis: No Has patient had a PCN reaction that required hospitalization No Has patient had a PCN reaction occurring within the last 10 years: No If all of the above answers are "NO", then may proceed with Cephalosporin use.   . Poison Sumac Extract Anaphylaxis    Severe blistering and breathing problems  . Carbamazepine Itching  . Fingolimod Hives    rash rash     Family History  Problem Relation Age of Onset  . Heart failure Mother   . Heart failure Father      Prior to Admission medications   Medication Sig Start Date End Date Taking? Authorizing Provider  acetaminophen (TYLENOL) 500 MG tablet Take by mouth every 6 (six) hours as needed for mild pain.     [provider]  baclofen (LIORESAL) 10 MG tablet Take 10 mg by mouth 3 (three) times daily.    [provider]  benzonatate (TESSALON) 200 MG capsule Take 200 mg by mouth 3 (three) times daily as needed. 12/13/18   [provider]  cholecalciferol (VITAMIN D) 1000 UNITS tablet Take 2,000 Units by mouth daily.    [provider]  docusate sodium (COLACE) 100  MG capsule Take 100 mg by mouth daily.     [provider]  fluticasone (FLONASE) 50 MCG/ACT nasal spray Place 1 spray into both nostrils daily.    [provider]  gabapentin (NEURONTIN) 600 MG tablet Take 600 mg by mouth 4 (four) times daily - after meals and at bedtime.    [provider]  levocetirizine (XYZAL) 5 MG tablet Take 5 mg by mouth at bedtime. 12/02/18   [provider]  montelukast (SINGULAIR) 10 MG tablet Take 10 mg  by mouth at bedtime.    [provider]  natalizumab (TYSABRI) 300 MG/15ML injection Inject 15 mLs into the vein every 30 (thirty) days.    [provider]  rizatriptan (MAXALT-MLT) 10 MG disintegrating tablet Take 10 mg by mouth as needed for migraine. May repeat in 2 hours if needed    [provider]  topiramate (TOPAMAX) 50 MG tablet Take 50 mg by mouth 2 (two) times daily.    [provider]  vitamin C (ASCORBIC ACID) 500 MG tablet Take 500 mg by mouth daily.    [provider]  zinc sulfate 50 MG CAPS capsule Take 220 mg by mouth daily. 12/25/18   [provider]    Physical Exam: Vitals:   01/01/19 0800 01/01/19 0830 01/01/19 0900 01/01/19 1004  BP: 105/60 98/63  102/65  Pulse: 63 65 69 65  Resp: 20 (!) 26 20 16   Temp:    98.5 F (36.9 C)  TempSrc:      SpO2: 92% 93% 97% 95%  Weight:      Height:        Constitutional: NAD, calm, comfortable Eyes: PERRL, lids and conjunctivae normal ENMT: Mucous membranes are moist. Posterior pharynx clear of any exudate or lesions.Normal dentition.  Neck: normal, supple, no masses, no thyromegaly Respiratory: clear to auscultation bilaterally, no wheezing, no crackles. Normal respiratory effort. No accessory muscle use.  Cardiovascular: Regular rate and rhythm, no murmurs / rubs / gallops. No extremity edema. 2+ pedal pulses. No carotid bruits.  Abdomen: no tenderness, no masses palpated. No hepatosplenomegaly. Bowel sounds positive.  Musculoskeletal: no clubbing / cyanosis. No joint deformity upper and lower extremities. Good ROM, no contractures. Normal muscle tone.  Skin: no rashes, lesions, ulcers. No induration Neurologic: CN 2-12 grossly intact. Sensation intact, DTR normal. Strength 5/5 in all 4.  Psychiatric: Normal judgment and insight. Alert and oriented x 3. Normal mood.    Labs on Admission: I have personally reviewed following labs and imaging studies  CBC: Recent Labs   Lab 12/30/18 1045 01/01/19 0255  WBC 5.7 6.6  NEUTROABS 3.7 4.0  HGB 13.8 13.8  HCT 40.5 40.9  MCV 92.7 93.2  PLT 107* 426*   Basic Metabolic Panel: Recent Labs  Lab 12/30/18 1045 01/01/19 0255  NA 139 138  K 3.6 3.3*  CL 107 105  CO2 22 23  GLUCOSE 120* 96  BUN 14 18  CREATININE 1.03 1.37*  CALCIUM 8.5* 8.4*   GFR: Estimated Creatinine Clearance: 100.9 mL/min (A) (by C-G formula based on SCr of 1.37 mg/dL (H)). Liver Function Tests: Recent Labs  Lab 12/30/18 1045 01/01/19 0255  AST 23 24  ALT 39 36  ALKPHOS 61 57  BILITOT 1.1 1.4*  PROT 7.0 6.9  ALBUMIN 3.9 4.0   No results for input(s): LIPASE, AMYLASE in the last 168 hours. No results for input(s): AMMONIA in the last 168 hours. Coagulation Profile: No results for input(s): INR, PROTIME in  the last 168 hours. Cardiac Enzymes: No results for input(s): CKTOTAL, CKMB, CKMBINDEX, TROPONINI in the last 168 hours. BNP (last 3 results) No results for input(s): PROBNP in the last 8760 hours. HbA1C: No results for input(s): HGBA1C in the last 72 hours. CBG: No results for input(s): GLUCAP in the last 168 hours. Lipid Profile: Recent Labs    01/01/19 0255  TRIG 45   Thyroid Function Tests: No results for input(s): TSH, T4TOTAL, FREET4, T3FREE, THYROIDAB in the last 72 hours. Anemia Panel: Recent Labs    01/01/19 0255  FERRITIN 633*   Urine analysis:    Component Value Date/Time   COLORURINE YELLOW 08/30/2012 2310   APPEARANCEUR CLEAR 08/30/2012 2310   LABSPEC >1.030 (H) 08/30/2012 2310   PHURINE 6.0 08/30/2012 2310   GLUCOSEU NEGATIVE 08/30/2012 2310   HGBUR SMALL (A) 08/30/2012 2310   BILIRUBINUR NEGATIVE 08/30/2012 2310   KETONESUR NEGATIVE 08/30/2012 2310   PROTEINUR NEGATIVE 08/30/2012 2310   UROBILINOGEN 0.2 08/30/2012 2310   NITRITE NEGATIVE 08/30/2012 2310   LEUKOCYTESUR NEGATIVE 08/30/2012 2310    Radiological Exams on Admission: Dg Chest Port 1 View  Result Date: 01/01/2019  CLINICAL DATA:  COVID, shortness of breath EXAM: PORTABLE CHEST 1 VIEW COMPARISON:  12/30/2018 FINDINGS: Heart is normal size. Vague patchy opacities in the lungs again noted, unchanged. No effusions. No acute bony abnormality. IMPRESSION: Vague patchy opacities within the lungs concerning for pneumonia. Electronically Signed   By: Charlett Nose M.D.   On: 01/01/2019 03:25    EKG: Independently reviewed.  Sinus rhythm without acute changes  Assessment/Plan Active Problems:   Pneumonia due to COVID-19 virus   MS (multiple sclerosis) (HCC)   Asthma in adult     1. COVID-19 pneumonia.  Patient has elevated inflammatory markers.  We will start him on dexamethasone.  Discussed with Dr. Thedore Mins at [facility] and with his low oxygen saturations on ambulation and chest x-ray findings of pneumonia, he will likely be started on remdesivir upon arrival to [facility].  Continue supportive measures. 2. Asthma.  No signs of wheezing at this time.  Continue bronchodilators as needed. 3. Multiple sclerosis.  Chronically on Tysabri.  DVT prophylaxis: Lovenox Code Status: Full code Family Communication: None present Disposition Plan: Admit to [facility] called:   Admission status: Observation, MedSurg  Erick Blinks MD [facility]   If 7PM-7AM, please contact night-coverage [email or web address]   01/01/2019, 10:36 AM

## 2019-01-01 NOTE — ED Triage Notes (Signed)
Pt reports sob since Sunday, pt Covid + (test came back positive on Monday) pt has seen PCP and finished z pack, hydroxychlorquine, still taking prednisone taper, pt seen here on 12/30/18 for the same and given steroid shot. Fever and sob have continued and gotten worse. Pt reports sputum was brown but now is blood tinged. Pt took tylenol last at 11:30 pm on 12/31/18

## 2019-01-01 NOTE — ED Notes (Signed)
Blood cultures drawn x 2 Xray at bedside.  Pt given ice chips

## 2019-01-01 NOTE — ED Notes (Signed)
ED Provider at bedside. 

## 2019-01-01 NOTE — ED Notes (Signed)
Pt was given meal tray.  

## 2019-01-01 NOTE — Plan of Care (Signed)
Initial care plan started.  

## 2019-01-02 LAB — CBC WITH DIFFERENTIAL/PLATELET
Abs Immature Granulocytes: 0.06 10*3/uL (ref 0.00–0.07)
Basophils Absolute: 0 10*3/uL (ref 0.0–0.1)
Basophils Relative: 0 %
Eosinophils Absolute: 0 10*3/uL (ref 0.0–0.5)
Eosinophils Relative: 0 %
HCT: 40.4 % (ref 39.0–52.0)
Hemoglobin: 13.6 g/dL (ref 13.0–17.0)
Immature Granulocytes: 1 %
Lymphocytes Relative: 15 %
Lymphs Abs: 1.1 10*3/uL (ref 0.7–4.0)
MCH: 31.3 pg (ref 26.0–34.0)
MCHC: 33.7 g/dL (ref 30.0–36.0)
MCV: 92.9 fL (ref 80.0–100.0)
Monocytes Absolute: 0.4 10*3/uL (ref 0.1–1.0)
Monocytes Relative: 6 %
Neutro Abs: 5.7 10*3/uL (ref 1.7–7.7)
Neutrophils Relative %: 78 %
Platelets: 124 10*3/uL — ABNORMAL LOW (ref 150–400)
RBC: 4.35 MIL/uL (ref 4.22–5.81)
RDW: 13 % (ref 11.5–15.5)
WBC: 7.3 10*3/uL (ref 4.0–10.5)
nRBC: 0 % (ref 0.0–0.2)

## 2019-01-02 LAB — TYPE AND SCREEN
ABO/RH(D): O NEG
Antibody Screen: NEGATIVE

## 2019-01-02 LAB — COMPREHENSIVE METABOLIC PANEL
ALT: 30 U/L (ref 0–44)
AST: 25 U/L (ref 15–41)
Albumin: 3.6 g/dL (ref 3.5–5.0)
Alkaline Phosphatase: 51 U/L (ref 38–126)
Anion gap: 8 (ref 5–15)
BUN: 17 mg/dL (ref 6–20)
CO2: 24 mmol/L (ref 22–32)
Calcium: 8.2 mg/dL — ABNORMAL LOW (ref 8.9–10.3)
Chloride: 107 mmol/L (ref 98–111)
Creatinine, Ser: 0.91 mg/dL (ref 0.61–1.24)
GFR calc Af Amer: >60 mL/min (ref >60)
GFR calc non Af Amer: >60 mL/min (ref >60)
Glucose, Bld: 94 mg/dL (ref 70–99)
Potassium: 4 mmol/L (ref 3.5–5.1)
Sodium: 139 mmol/L (ref 135–145)
Total Bilirubin: 1.3 mg/dL — ABNORMAL HIGH (ref 0.3–1.2)
Total Protein: 6.7 g/dL (ref 6.5–8.1)

## 2019-01-02 LAB — C-REACTIVE PROTEIN: CRP: 16.6 mg/dL — ABNORMAL HIGH (ref <1.0)

## 2019-01-02 LAB — BRAIN NATRIURETIC PEPTIDE: B Natriuretic Peptide: 50.3 pg/mL (ref 0.0–100.0)

## 2019-01-02 LAB — MAGNESIUM: Magnesium: 1.8 mg/dL (ref 1.7–2.4)

## 2019-01-02 LAB — HIV ANTIBODY (ROUTINE TESTING W REFLEX): HIV Screen 4th Generation wRfx: NONREACTIVE — AB

## 2019-01-02 LAB — D-DIMER, QUANTITATIVE: D-Dimer, Quant: 0.57 ug/mL-FEU — ABNORMAL HIGH (ref 0.00–0.50)

## 2019-01-02 MED ORDER — HYDROCORTISONE 1 % EX CREA
TOPICAL_CREAM | Freq: Four times a day (QID) | CUTANEOUS | Status: DC | PRN
  Filled 2019-01-02: qty 28

## 2019-01-02 MED ORDER — DIPHENHYDRAMINE HCL 50 MG/ML IJ SOLN
25.0000 mg | Freq: Four times a day (QID) | INTRAMUSCULAR | Status: DC | PRN
  Administered 2019-01-02 – 2019-01-04 (×5): 25 mg via INTRAVENOUS
  Filled 2019-01-02 (×6): qty 1

## 2019-01-02 MED ORDER — DEXAMETHASONE SODIUM PHOSPHATE 10 MG/ML IJ SOLN
6.0000 mg | INTRAMUSCULAR | Status: DC
  Administered 2019-01-02 – 2019-01-05 (×4): 6 mg via INTRAVENOUS
  Filled 2019-01-02 (×4): qty 1

## 2019-01-02 MED ORDER — SODIUM CHLORIDE 0.9 % IV SOLN: 25.0000 mg | Freq: Four times a day (QID) | INTRAVENOUS | Status: DC | PRN

## 2019-01-02 NOTE — Evaluation (Signed)
Physical Therapy Evaluation and Discharge Patient Details Name: Troy Lyons MRN: [id number] DOB: 1980-07-05 Today's Date: 01/02/2019   History of Present Illness  38 y.o. male with medical history significant of asthma and multiple sclerosis, began having shortness of breath, cough and fever approximately 1 week ago.  He tested positive for COVID-19 on 11/9; emergency room on 11/14, received a dose of Decadron and albuterol treatment and discharged home; 11/16 to ED with drop in sats and early pna and admitted.   Clinical Impression   Patient evaluated by Physical Therapy with no further PT needs identified. All education has been completed and the patient has no further questions. Education included use of IS & flutter valve, best positioning for oxygenation, safe mobilizing in his room (RN approved up in room alone), and hydration. PT is signing off. Thank you for this referral.     Follow Up Recommendations No PT follow up    Equipment Recommendations  None recommended by PT    Recommendations for Other Services       Precautions / Restrictions Precautions Precautions: None      Mobility  Bed Mobility Overal bed mobility: Independent                Transfers Overall transfer level: Independent Equipment used: None                Ambulation/Gait Ambulation/Gait assistance: Modified independent (Device/Increase time);Supervision Gait Distance (Feet): 300 Feet Assistive device: IV Pole;None Gait Pattern/deviations: WFL(Within Functional Limits) Gait velocity: slower than usual   General Gait Details: slower pace than usual to try to control coughing; able to push IV pole without difficulty; educated if nursing allows him to walk independently in halls, must wear mask  Stairs            Wheelchair Mobility    Modified Rankin (Stroke Patients Only)       Balance Overall balance assessment: Independent                                            Pertinent Vitals/Pain Pain Assessment: Faces Faces Pain Scale: Hurts little more Pain Location: abdomen and ribs from coughing Pain Descriptors / Indicators: Sore Pain Intervention(s): Limited activity within patient's tolerance;Monitored during session;Other (comment)(educated in use of pillow to splint when coughing)    Home Living Family/patient expects to be discharged to:: Private residence Living Arrangements: Spouse/significant other;Children Available Help at Discharge: Family;Available 24 hours/day Type of Home: House       Home Layout: Two level;Able to live on main level with bedroom/bathroom;Bed/bath upstairs(his bedroom is upstairs, but did move downstairs to separtat) Home Equipment: None      Prior Function Level of Independence: Independent         Comments: reports no MS symptoms since on meds; initially had vision changes when diagnosed     Hand Dominance        Extremity/Trunk Assessment   Upper Extremity Assessment Upper Extremity Assessment: Defer to OT evaluation    Lower Extremity Assessment Lower Extremity Assessment: Overall WFL for tasks assessed    Cervical / Trunk Assessment Cervical / Trunk Assessment: Normal  Communication   Communication: No difficulties  Cognition Arousal/Alertness: Awake/alert Behavior During Therapy: WFL for tasks assessed/performed Overall Cognitive Status: Within Functional Limits for tasks assessed  General Comments      Exercises Other Exercises Other Exercises: pt able to verbalize correct frequency for IS and flutter valve; demonstrated correct technique, however cues to rest between each breath to allow best effort each time; pulls 1250 ml on IS   Assessment/Plan    PT Assessment Patent does not need any further PT services  PT Problem List         PT Treatment Interventions      PT Goals (Current goals can be  found in the Care Plan section)  Acute Rehab PT Goals Patient Stated Goal: get better and go home PT Goal Formulation: All assessment and education complete, DC therapy    Frequency     Barriers to discharge        Co-evaluation               AM-PAC PT "6 Clicks" Mobility  Outcome Measure Help needed turning from your back to your side while in a flat bed without using bedrails?: None Help needed moving from lying on your back to sitting on the side of a flat bed without using bedrails?: None Help needed moving to and from a bed to a chair (including a wheelchair)?: None Help needed standing up from a chair using your arms (e.g., wheelchair or bedside chair)?: None Help needed to walk in hospital room?: None Help needed climbing 3-5 steps with a railing? : None 6 Click Score: 24    End of Session   Activity Tolerance: Patient tolerated treatment well Patient left: in chair;with call bell/phone within reach Nurse Communication: Mobility status;Other (comment)(no PT needs) PT Visit Diagnosis: Difficulty in walking, not elsewhere classified (R26.2)    Time: 8676-1950 PT Time Calculation (min) (ACUTE ONLY): 25 min   Charges:   PT Evaluation $PT Eval Low Complexity: 1 Low PT Treatments $Self Care/Home Management: 8-22         Veda Canning, PT    Scherrie November Mahlia Fernando 01/02/2019, 9:05 AM

## 2019-01-02 NOTE — Progress Notes (Signed)
PROGRESS NOTE                                                                                                                                                                                                             Patient Demographics:    Troy Lyons, is a 38 y.o. male, DOB - 09/10/1980, ZHY:[id number]  Outpatient Primary MD for the patient is Ginger Organ    LOS - 1  Admit date - 01/01/2019    Chief Complaint  Patient presents with  . Shortness of Breath       Brief Narrative - patient with history of MS was admitted to the hospital with chief complaints of cough and mild exertional shortness of breath.  Diagnosed with COVID-19 infection at any pain hospital and transferred to [facility] for further care.  He likely also has Covid rash.   Subjective:    Troy Lyons today has, No headache, No chest pain, No abdominal pain - No Nausea, No new weakness tingling or numbness, no Cough - SOB.     Assessment  & Plan :     1. Acute Hypoxic Resp. Failure due to Acute Covid 19 Viral Pneumonitis during the ongoing 2020 Covid 19 Pandemic - mild to moderate disease at best, required 2 L nasal cannula oxygen upon admission.  Has been started on IV steroids and remdesivir with good improvement, at rest he is on room air and appears to be stable.  Continue.  Finished remdesivir course and then discharged home.   SpO2: 95 % O2 Flow Rate (L/min): 2 L/min(placed on 02'@2l'$ /m)  Hepatic Function Latest Ref Rng & Units 01/02/2019 01/01/2019 12/30/2018  Total Protein 6.5 - 8.1 g/dL 6.7 6.9 7.0  Albumin 3.5 - 5.0 g/dL 3.6 4.0 3.9  AST 15 - 41 U/L '25 24 23  '$ ALT 0 - 44 U/L 30 36 39  Alk Phosphatase 38 - 126 U/L 51 57 61  Total Bilirubin 0.3 - 1.2 mg/dL 1.3(H) 1.4(H) 1.1    COVID-19 Labs  Recent Labs    01/01/19 0255 01/02/19 0235  DDIMER 0.42  --   FERRITIN 633*  --   LDH 206*  --   CRP 8.1* 16.6*    Lab Results   Component Value Date   SARSCOV2NAA Not Detected 12/13/2018       Component Value  Date/Time   BNP 50.3 01/02/2019 0235      2.  MS.  Continue home regimen.  PT OT seeing patient here.  Appears stable.  3.  Macular rash on dorsal aspects of palms and soles, minimally itchy.  On Benadryl and steroids.  Question if this is due to Covid.      Condition - Fair  Family Communication  :  None  Code Status :  Full  Diet :   Diet Order            Diet regular Room service appropriate? Yes; Fluid consistency: Thin  Diet effective now               Disposition Plan  :  Inpt  Consults  : None  Procedures  :    PUD Prophylaxis :    DVT Prophylaxis  :  Lovenox    Lab Results  Component Value Date   PLT 124 (L) 01/02/2019    Inpatient Medications  Scheduled Meds: . albuterol  2 puff Inhalation Q6H  . baclofen  10 mg Oral TID  . cholecalciferol  2,000 Units Oral Daily  . dexamethasone (DECADRON) injection  6 mg Intravenous Q24H  . dextromethorphan-guaiFENesin  1 tablet Oral BID  . docusate sodium  100 mg Oral Daily  . enoxaparin (LOVENOX) injection  40 mg Subcutaneous Q24H  . fluticasone  1 spray Each Nare Daily  . gabapentin  300 mg Oral BID  . gabapentin  600 mg Oral BID  . montelukast  10 mg Oral q morning - 10a  . topiramate  50 mg Oral BID  . vitamin C  500 mg Oral Daily  . zinc sulfate  220 mg Oral Daily   Continuous Infusions: . sodium chloride 100 mL/hr at 01/01/19 1419  . remdesivir 100 mg in NS 250 mL 100 mg (01/02/19 0916)   PRN Meds:.acetaminophen, chlorpheniramine-HYDROcodone, diphenhydrAMINE, guaiFENesin-dextromethorphan, ondansetron **OR** ondansetron (ZOFRAN) IV  Antibiotics  :    Anti-infectives (From admission, onward)   Start     Dose/Rate Route Frequency Ordered Stop   01/02/19 1000  remdesivir 100 mg in sodium chloride 0.9 % 250 mL IVPB     100 mg 500 mL/hr over 30 Minutes Intravenous Every 24 hours 01/01/19 1305 01/06/19 0959    01/01/19 1400  remdesivir 200 mg in sodium chloride 0.9 % 250 mL IVPB     200 mg 500 mL/hr over 30 Minutes Intravenous Once 01/01/19 1305 01/01/19 1529       Time Spent in minutes  30   Lala Lund M.D on 01/02/2019 at 11:27 AM  To page go to [email or web address] - password [facility]  [facility] -  Office  [phone]   See all Orders from today for further details    Objective:   Vitals:   01/02/19 0405 01/02/19 0730 01/02/19 0800 01/02/19 0900  BP: (!) 103/59 113/63 112/63   Pulse: 75 78 81 91  Resp: 20 (!) 24 (!) 38 (!) 24  Temp: 99 F (37.2 C) (!) 100.6 F (38.1 C)  99.4 F (37.4 C)  TempSrc: Oral Oral  Oral  SpO2: 91% 93% 95% 95%  Weight:      Height:        Wt Readings from Last 3 Encounters:  01/01/19 114.8 kg  12/30/18 112.5 kg  06/05/16 126.6 kg     Intake/Output Summary (Last 24 hours) at 01/02/2019 1127 Last data filed at 01/02/2019 0400 Gross per 24 hour  Intake 1250 ml  Output -  Net 1250 ml     Physical Exam  Awake Alert, Oriented X 3, No new F.N deficits, Normal affect [place].AT,PERRAL Supple Neck,No JVD, No cervical lymphadenopathy appriciated.  Symmetrical Chest wall movement, Good air movement bilaterally, CTAB RRR,No Gallops,Rubs or new Murmurs, No Parasternal Heave +ve B.Sounds, Abd Soft, No tenderness, No organomegaly appriciated, No rebound - guarding or rigidity. No Cyanosis, macular rash noted on the dorsal aspect of both hands and fingers, also some on his feet on the dorsal aspect.  Not spreading and not getting worse per patient for the last 1 week    Data Review:    CBC Recent Labs  Lab 12/30/18 1045 01/01/19 0255 01/01/19 1345 01/02/19 0235  WBC 5.7 6.6 5.8 7.3  HGB 13.8 13.8 14.1 13.6  HCT 40.5 40.9 41.6 40.4  PLT 107* 112* 114* 124*  MCV 92.7 93.2 92.7 92.9  MCH 31.6 31.4 31.4 31.3  MCHC 34.1 33.7 33.9 33.7  RDW 12.8 12.7 12.8 13.0  LYMPHSABS 1.3 2.0  --  1.1  MONOABS 0.6 0.5  --  0.4  EOSABS 0.0 0.0  --   0.0  BASOSABS 0.0 0.0  --  0.0    Chemistries  Recent Labs  Lab 12/30/18 1045 01/01/19 0255 01/01/19 1345 01/02/19 0235  NA 139 138  --  139  K 3.6 3.3*  --  4.0  CL 107 105  --  107  CO2 22 23  --  24  GLUCOSE 120* 96  --  94  BUN 14 18  --  17  CREATININE 1.03 1.37* 1.11 0.91  CALCIUM 8.5* 8.4*  --  8.2*  MG  --   --   --  1.8  AST 23 24  --  25  ALT 39 36  --  30  ALKPHOS 61 57  --  51  BILITOT 1.1 1.4*  --  1.3*   ------------------------------------------------------------------------------------------------------------------ Recent Labs    01/01/19 0255  TRIG 45    No results found for: HGBA1C ------------------------------------------------------------------------------------------------------------------ No results for input(s): TSH, T4TOTAL, T3FREE, THYROIDAB in the last 72 hours.  Invalid input(s): FREET3  Cardiac Enzymes No results for input(s): CKMB, TROPONINI, MYOGLOBIN in the last 168 hours.  Invalid input(s): CK ------------------------------------------------------------------------------------------------------------------    Component Value Date/Time   BNP 50.3 01/02/2019 0235    Micro Results Recent Results (from the past 240 hour(s))  Blood Culture (routine x 2)     Status: None (Preliminary result)   Collection Time: 01/01/19  2:55 AM   Specimen: Right Antecubital; Blood  Result Value Ref Range Status   Specimen Description RIGHT ANTECUBITAL  Final   Special Requests   Final    BOTTLES DRAWN AEROBIC AND ANAEROBIC Blood Culture adequate volume   Culture   Final    NO GROWTH 1 DAY Performed at [facility], [address] [place], [place] [address]    Report Status PENDING  Incomplete  Blood Culture (routine x 2)     Status: None (Preliminary result)   Collection Time: 01/01/19  3:27 AM   Specimen: Left Antecubital; Blood  Result Value Ref Range Status   Specimen Description LEFT ANTECUBITAL  Final   Special Requests   Final     BOTTLES DRAWN AEROBIC AND ANAEROBIC Blood Culture adequate volume   Culture   Final    NO GROWTH 1 DAY Performed at [facility], [address], [place], [place] [address]    Report Status PENDING  Incomplete  Radiology Reports Dg Chest Port 1 View  Result Date: 01/01/2019 CLINICAL DATA:  COVID, shortness of breath EXAM: PORTABLE CHEST 1 VIEW COMPARISON:  12/30/2018 FINDINGS: Heart is normal size. Vague patchy opacities in the lungs again noted, unchanged. No effusions. No acute bony abnormality. IMPRESSION: Vague patchy opacities within the lungs concerning for pneumonia. Electronically Signed   By: Rolm Baptise M.D.   On: 01/01/2019 03:25   Dg Chest Portable 1 View  Result Date: 12/30/2018 CLINICAL DATA:  Shortness of breath, COVID positive EXAM: PORTABLE CHEST 1 VIEW COMPARISON:  06/05/2016 FINDINGS: The heart size and mediastinal contours are within normal limits. Subtle bilateral peripheral heterogeneous airspace opacity. The visualized skeletal structures are unremarkable. IMPRESSION: Subtle bilateral peripheral heterogeneous airspace opacity, generally in keeping with reported diagnosis of COVID-19. Electronically Signed   By: Eddie Candle M.D.   On: 12/30/2018 09:59

## 2019-01-02 NOTE — Progress Notes (Signed)
Patient up to chair majority of the day. No s/s of pain or distress. Patient spoke to spouse about care plan update. All medication given well tolerated. Will continue to monitor for remainder of shift.

## 2019-01-02 NOTE — Progress Notes (Signed)
Occupational Therapy Evaluation  PTA, pt independent and worked as a Engineer, structural in [place]. Married and has 2 young children. Pt reports getting SOB with increased activity. SpO2 @ 95 after activity with ambulation with 2/4 DOE. Reviewed use of flutter valve and incentive spirometer with good return demonstration. Educated pt on pursed lip breathing and began education on level 3 theraband HEP. Will follow up to review energy conservation and activity tolerance to facilitate safe DC home. Pt reports he is not experiencing increased anxiety with SOB.     01/02/19 1100  OT Visit Information  Last OT Received On 01/02/19  Assistance Needed +1  History of Present Illness 38 y.o. male with medical history significant of asthma and multiple sclerosis, began having shortness of breath, cough and fever approximately 1 week ago.  He tested positive for COVID-19 on 11/9; emergency room on 11/14, received a dose of Decadron and albuterol treatment and discharged home; 11/16 to ED with drop in sats and early pna and admitted.   Precautions  Precautions None  Home Living  Family/patient expects to be discharged to: Private residence  Living Arrangements Spouse/significant other  Available Help at Discharge Family;Available 24 hours/day  Type of Home House  Home Access Stairs to enter (4 steps to enter)  Entrance Stairs-Number of Steps 4  Home Layout Two level;Able to live on main level with bedroom/bathroom  Alternate Level Stairs-Number of Steps flight  Bathroom Shower/Tub Walk-in Cytogeneticist Yes  Home Equipment None  Additional Comments States he can borrow a shower seat  Prior Function  Level of Independence Independent  Comments states overall weaker; works as Clinical biochemist; retired Designer, multimedia No difficulties  Pain Assessment  Pain Assessment 0-10  Pain Score  (4-5/10)  Pain Location Back and ribs from coughing   Pain Descriptors / Indicators Sore  Pain Intervention(s) Limited activity within patient's tolerance  Cognition  Arousal/Alertness Awake/alert  Behavior During Therapy WFL for tasks assessed/performed  Overall Cognitive Status Within Functional Limits for tasks assessed  Upper Extremity Assessment  Upper Extremity Assessment Generalized weakness  Lower Extremity Assessment  Lower Extremity Assessment Defer to PT evaluation  Cervical / Trunk Assessment  Cervical / Trunk Assessment Normal  ADL  Overall ADL's  Needs assistance/impaired  Functional mobility during ADLs Modified independent  General ADL Comments Pt gets SOB ealiy with ADL tasks reports increased weakness  Bed Mobility  Overal bed mobility Independent  Transfers  Overall transfer level Independent  Balance  Overall balance assessment Independent  Other Exercises  Other Exercises incentive spirometer x 10  Other Exercises flutter valve x 10  Other Exercises began educaitonon level 3 theraband HEP - written handouts provided  OT - End of Session  Activity Tolerance Patient tolerated treatment well  Patient left in chair;with call bell/phone within reach  Nurse Communication Mobility status  OT Assessment  OT Recommendation/Assessment Patient needs continued OT Services  OT Visit Diagnosis Muscle weakness (generalized) (M62.81);Pain  Pain - part of body  (chest/back from coughing)  OT Problem List Cardiopulmonary status limiting activity;Pain;Decreased knowledge of use of DME or AE;Decreased activity tolerance  OT Plan  OT Frequency (ACUTE ONLY) Min 2X/week  OT Treatment/Interventions (ACUTE ONLY) Self-care/ADL training;Therapeutic exercise;Neuromuscular education;Energy conservation;DME and/or AE instruction;Patient/family education;Therapeutic activities  AM-PAC OT "6 Clicks" Daily Activity Outcome Measure (Version 2)  Help from another person eating meals? 4  Help from another person taking care of personal  grooming? 4  Help from  another person toileting, which includes using toliet, bedpan, or urinal? 4  Help from another person bathing (including washing, rinsing, drying)? 4  Help from another person to put on and taking off regular upper body clothing? 4  Help from another person to put on and taking off regular lower body clothing? 4  6 Click Score 24  OT Recommendation  Follow Up Recommendations No OT follow up  OT Equipment None recommended by OT  Individuals Consulted  Consulted and Agree with Results and Recommendations Patient  Acute Rehab OT Goals  Patient Stated Goal get better and go home  OT Goal Formulation With patient  Time For Goal Achievement 01/16/19  Potential to Achieve Goals Good  OT Time Calculation  OT Start Time (ACUTE ONLY) 1130  OT Stop Time (ACUTE ONLY) 1155  OT Time Calculation (min) 25 min  OT General Charges  $OT Visit 1 Visit  OT Evaluation  $OT Eval Moderate Complexity 1 Mod  OT Treatments  $Self Care/Home Management  8-22 mins  Written Expression  Dominant Hand Right  Luisa Dago, OT/L   Acute OT Clinical Specialist Acute Rehabilitation Services Pager [phone] Office [phone]

## 2019-01-03 DIAGNOSIS — E861 Hypovolemia: Secondary | ICD-10-CM

## 2019-01-03 DIAGNOSIS — I9589 Other hypotension: Secondary | ICD-10-CM

## 2019-01-03 DIAGNOSIS — J9601 Acute respiratory failure with hypoxia: Secondary | ICD-10-CM

## 2019-01-03 LAB — C-REACTIVE PROTEIN: CRP: 12.3 mg/dL — ABNORMAL HIGH (ref <1.0)

## 2019-01-03 LAB — CBC WITH DIFFERENTIAL/PLATELET
Abs Immature Granulocytes: 0.06 10*3/uL (ref 0.00–0.07)
Basophils Absolute: 0 10*3/uL (ref 0.0–0.1)
Basophils Relative: 0 %
Eosinophils Absolute: 0 10*3/uL (ref 0.0–0.5)
Eosinophils Relative: 0 %
HCT: 39.3 % (ref 39.0–52.0)
Hemoglobin: 13.4 g/dL (ref 13.0–17.0)
Immature Granulocytes: 1 %
Lymphocytes Relative: 26 %
Lymphs Abs: 1.4 10*3/uL (ref 0.7–4.0)
MCH: 30.9 pg (ref 26.0–34.0)
MCHC: 34.1 g/dL (ref 30.0–36.0)
MCV: 90.8 fL (ref 80.0–100.0)
Monocytes Absolute: 0.5 10*3/uL (ref 0.1–1.0)
Monocytes Relative: 9 %
Neutro Abs: 3.3 10*3/uL (ref 1.7–7.7)
Neutrophils Relative %: 64 %
Platelets: 133 10*3/uL — ABNORMAL LOW (ref 150–400)
RBC: 4.33 MIL/uL (ref 4.22–5.81)
RDW: 12.7 % (ref 11.5–15.5)
WBC: 5.2 10*3/uL (ref 4.0–10.5)
nRBC: 0 % (ref 0.0–0.2)

## 2019-01-03 LAB — COMPREHENSIVE METABOLIC PANEL
ALT: 31 U/L (ref 0–44)
AST: 24 U/L (ref 15–41)
Albumin: 3.3 g/dL — ABNORMAL LOW (ref 3.5–5.0)
Alkaline Phosphatase: 53 U/L (ref 38–126)
Anion gap: 9 (ref 5–15)
BUN: 16 mg/dL (ref 6–20)
CO2: 23 mmol/L (ref 22–32)
Calcium: 8.8 mg/dL — ABNORMAL LOW (ref 8.9–10.3)
Chloride: 109 mmol/L (ref 98–111)
Creatinine, Ser: 0.82 mg/dL (ref 0.61–1.24)
GFR calc Af Amer: >60 mL/min (ref >60)
GFR calc non Af Amer: >60 mL/min (ref >60)
Glucose, Bld: 106 mg/dL — ABNORMAL HIGH (ref 70–99)
Potassium: 4 mmol/L (ref 3.5–5.1)
Sodium: 141 mmol/L (ref 135–145)
Total Bilirubin: 1 mg/dL (ref 0.3–1.2)
Total Protein: 6.5 g/dL (ref 6.5–8.1)

## 2019-01-03 LAB — BRAIN NATRIURETIC PEPTIDE: B Natriuretic Peptide: 78 pg/mL (ref 0.0–100.0)

## 2019-01-03 LAB — MAGNESIUM: Magnesium: 1.8 mg/dL (ref 1.7–2.4)

## 2019-01-03 LAB — D-DIMER, QUANTITATIVE: D-Dimer, Quant: 0.38 ug/mL-FEU (ref 0.00–0.50)

## 2019-01-03 MED ORDER — ZOLPIDEM TARTRATE 5 MG PO TABS
5.0000 mg | ORAL_TABLET | Freq: Every evening | ORAL | Status: DC | PRN
  Administered 2019-01-03 – 2019-01-04 (×2): 5 mg via ORAL
  Filled 2019-01-03 (×2): qty 1

## 2019-01-03 NOTE — Progress Notes (Signed)
[facility] PROGRESS NOTE    Progress Note  Troy Lyons  MMN:[id number] DOB: May 05, 1980 DOA: 01/01/2019 PCP: Shawnie Dapper, PA-C     Brief Narrative:   Troy Lyons is an 38 y.o. male past medical history of MS admitted to the hospital with chief complaint of cough mild extension and shortness of breath diagnosed with COVID-19 and transferred to [facility] for further evaluation.  Assessment/Plan:   Acute respiratory failure with hypoxia (HCC) due to Pneumonia due to COVID-19 virus: On admission he was requiring 2 L of oxygen to keep saturations greater 95%. He has been weaned to room air. He is currently on IV remdesivir steroids for which she will complete 5-day course.  MS (multiple sclerosis) (HCC) He will continue to work with physical therapy.  Macular papular rash: On the dorsal aspect of palm and soles continue Benadryl and steroids cream. Likely due to Covid. Now is improved.   DVT prophylaxis: lovenox Family Communication:none Disposition Plan/Barrier to D/C: home in 3 days Code Status:     Code Status Orders  (From admission, onward)         Start     Ordered   01/01/19 1305  Full code  Continuous     01/01/19 1304        Code Status History    This patient has a current code status but no historical code status.   Advance Care Planning Activity        IV Access:    Peripheral IV   Procedures and diagnostic studies:   No results found.   Medical Consultants:    None.  Anti-Infectives:   IV remdesivir.  Subjective:    Troy Lyons relates his breathing is improving.  He relates this rash flares up the last 2 nights, but this morning is significantly improved.  Objective:    Vitals:   01/02/19 2035 01/03/19 0000 01/03/19 0405 01/03/19 0800  BP: 125/77  115/60 111/66  Pulse: 73 (!) 52 (!) 58 (!) 54  Resp: 20 20 18  (!) 22  Temp: 98.1 F (36.7 C)  97.7 F (36.5 C) 98.4 F (36.9 C)  TempSrc: Oral  Oral  Oral  SpO2: 98% 94% 95% 94%  Weight:      Height:       SpO2: 94 % O2 Flow Rate (L/min): 2 L/min(placed on 02@2l /m)  No intake or output data in the 24 hours ending 01/03/19 0923 Filed Weights   01/01/19 0229 01/01/19 1121  Weight: 112.5 kg 114.8 kg    Exam: General exam: In no acute distress. Respiratory system: Good air movement and clear to auscultation. Cardiovascular system: S1 & S2 heard, RRR. No JVD. Gastrointestinal system: Abdomen is nondistended, soft and nontender.  Central nervous system: Alert and oriented. No focal neurological deficits. Extremities: No pedal edema. Skin: No rashes, lesions or ulcers Psychiatry: Judgement and insight appear normal. Mood & affect appropriate.    Data Reviewed:    Labs: Basic Metabolic Panel: Recent Labs  Lab 12/30/18 1045 01/01/19 0255 01/01/19 1345 01/02/19 0235 01/03/19 0622  NA 139 138  --  139 141  K 3.6 3.3*  --  4.0 4.0  CL 107 105  --  107 109  CO2 22 23  --  24 23  GLUCOSE 120* 96  --  94 106*  BUN 14 18  --  17 16  CREATININE 1.03 1.37* 1.11 0.91 0.82  CALCIUM 8.5* 8.4*  --  8.2* 8.8*  MG  --   --   --  1.8 1.8   GFR Estimated Creatinine Clearance: 170.2 mL/min (by C-G formula based on SCr of 0.82 mg/dL). Liver Function Tests: Recent Labs  Lab 12/30/18 1045 01/01/19 0255 01/02/19 0235 01/03/19 0622  AST 23 24 25 24   ALT 39 36 30 31  ALKPHOS 61 57 51 53  BILITOT 1.1 1.4* 1.3* 1.0  PROT 7.0 6.9 6.7 6.5  ALBUMIN 3.9 4.0 3.6 3.3*   No results for input(s): LIPASE, AMYLASE in the last 168 hours. No results for input(s): AMMONIA in the last 168 hours. Coagulation profile No results for input(s): INR, PROTIME in the last 168 hours. COVID-19 Labs  Recent Labs    01/01/19 0255 01/02/19 0235 01/03/19 0622  DDIMER 0.42 0.57* 0.38  FERRITIN 633*  --   --   LDH 206*  --   --   CRP 8.1* 16.6* 12.3*    Lab Results  Component Value Date   SARSCOV2NAA Not Detected 12/13/2018    CBC: Recent  Labs  Lab 12/30/18 1045 01/01/19 0255 01/01/19 1345 01/02/19 0235 01/03/19 0622  WBC 5.7 6.6 5.8 7.3 5.2  NEUTROABS 3.7 4.0  --  5.7 3.3  HGB 13.8 13.8 14.1 13.6 13.4  HCT 40.5 40.9 41.6 40.4 39.3  MCV 92.7 93.2 92.7 92.9 90.8  PLT 107* 112* 114* 124* 133*   Cardiac Enzymes: No results for input(s): CKTOTAL, CKMB, CKMBINDEX, TROPONINI in the last 168 hours. BNP (last 3 results) No results for input(s): PROBNP in the last 8760 hours. CBG: No results for input(s): GLUCAP in the last 168 hours. D-Dimer: Recent Labs    01/02/19 0235 01/03/19 0622  DDIMER 0.57* 0.38   Hgb A1c: No results for input(s): HGBA1C in the last 72 hours. Lipid Profile: Recent Labs    01/01/19 0255  TRIG 45   Thyroid function studies: No results for input(s): TSH, T4TOTAL, T3FREE, THYROIDAB in the last 72 hours.  Invalid input(s): FREET3 Anemia work up: Recent Labs    01/01/19 0255  FERRITIN 633*   Sepsis Labs: Recent Labs  Lab 01/01/19 0232 01/01/19 0255 01/01/19 1345 01/02/19 0235 01/03/19 0622  PROCALCITON  --  <0.10  --   --   --   WBC  --  6.6 5.8 7.3 5.2  LATICACIDVEN 1.0  --   --   --   --    Microbiology Recent Results (from the past 240 hour(s))  Blood Culture (routine x 2)     Status: None (Preliminary result)   Collection Time: 01/01/19  2:55 AM   Specimen: Right Antecubital; Blood  Result Value Ref Range Status   Specimen Description RIGHT ANTECUBITAL  Final   Special Requests   Final    BOTTLES DRAWN AEROBIC AND ANAEROBIC Blood Culture adequate volume   Culture   Final    NO GROWTH 2 DAYS Performed at [facility], [address], [place], [place]    Report Status PENDING  Incomplete  Blood Culture (routine x 2)     Status: None (Preliminary result)   Collection Time: 01/01/19  3:27 AM   Specimen: Left Antecubital; Blood  Result Value Ref Range Status   Specimen Description LEFT ANTECUBITAL  Final   Special Requests   Final    BOTTLES DRAWN AEROBIC  AND ANAEROBIC Blood Culture adequate volume   Culture   Final    NO GROWTH 2 DAYS Performed at [facility], [address], [place], [place]    Report Status PENDING  Incomplete  Medications:   . albuterol  2 puff Inhalation Q6H  . baclofen  10 mg Oral TID  . cholecalciferol  2,000 Units Oral Daily  . dexamethasone (DECADRON) injection  6 mg Intravenous Q24H  . dextromethorphan-guaiFENesin  1 tablet Oral BID  . docusate sodium  100 mg Oral Daily  . enoxaparin (LOVENOX) injection  40 mg Subcutaneous Q24H  . fluticasone  1 spray Each Nare Daily  . gabapentin  300 mg Oral BID  . gabapentin  600 mg Oral BID  . montelukast  10 mg Oral q morning - 10a  . topiramate  50 mg Oral BID  . vitamin C  500 mg Oral Daily  . zinc sulfate  220 mg Oral Daily   Continuous Infusions: . sodium chloride 100 mL/hr at 01/01/19 1419  . remdesivir 100 mg in NS 250 mL Stopped (01/02/19 0946)      LOS: 2 days   Charlynne Cousins  [facility]  01/03/2019, 9:23 AM

## 2019-01-04 LAB — CBC WITH DIFFERENTIAL/PLATELET
Abs Immature Granulocytes: 0.06 10*3/uL (ref 0.00–0.07)
Basophils Absolute: 0 10*3/uL (ref 0.0–0.1)
Basophils Relative: 0 %
Eosinophils Absolute: 0 10*3/uL (ref 0.0–0.5)
Eosinophils Relative: 0 %
HCT: 38.8 % — ABNORMAL LOW (ref 39.0–52.0)
Hemoglobin: 13.3 g/dL (ref 13.0–17.0)
Immature Granulocytes: 1 %
Lymphocytes Relative: 25 %
Lymphs Abs: 1.2 10*3/uL (ref 0.7–4.0)
MCH: 31.1 pg (ref 26.0–34.0)
MCHC: 34.3 g/dL (ref 30.0–36.0)
MCV: 90.7 fL (ref 80.0–100.0)
Monocytes Absolute: 0.5 10*3/uL (ref 0.1–1.0)
Monocytes Relative: 11 %
Neutro Abs: 2.8 10*3/uL (ref 1.7–7.7)
Neutrophils Relative %: 63 %
Platelets: 160 10*3/uL (ref 150–400)
RBC: 4.28 MIL/uL (ref 4.22–5.81)
RDW: 12.7 % (ref 11.5–15.5)
WBC: 4.5 10*3/uL (ref 4.0–10.5)
nRBC: 0.7 % — ABNORMAL HIGH (ref 0.0–0.2)

## 2019-01-04 LAB — COMPREHENSIVE METABOLIC PANEL
ALT: 36 U/L (ref 0–44)
AST: 25 U/L (ref 15–41)
Albumin: 3.3 g/dL — ABNORMAL LOW (ref 3.5–5.0)
Alkaline Phosphatase: 58 U/L (ref 38–126)
Anion gap: 9 (ref 5–15)
BUN: 18 mg/dL (ref 6–20)
CO2: 20 mmol/L — ABNORMAL LOW (ref 22–32)
Calcium: 8.8 mg/dL — ABNORMAL LOW (ref 8.9–10.3)
Chloride: 108 mmol/L (ref 98–111)
Creatinine, Ser: 0.82 mg/dL (ref 0.61–1.24)
GFR calc Af Amer: >60 mL/min (ref >60)
GFR calc non Af Amer: >60 mL/min (ref >60)
Glucose, Bld: 136 mg/dL — ABNORMAL HIGH (ref 70–99)
Potassium: 4.2 mmol/L (ref 3.5–5.1)
Sodium: 137 mmol/L (ref 135–145)
Total Bilirubin: 0.6 mg/dL (ref 0.3–1.2)
Total Protein: 6.5 g/dL (ref 6.5–8.1)

## 2019-01-04 LAB — BRAIN NATRIURETIC PEPTIDE: B Natriuretic Peptide: 47.1 pg/mL (ref 0.0–100.0)

## 2019-01-04 LAB — D-DIMER, QUANTITATIVE: D-Dimer, Quant: 0.3 ug/mL-FEU (ref 0.00–0.50)

## 2019-01-04 LAB — MAGNESIUM: Magnesium: 1.9 mg/dL (ref 1.7–2.4)

## 2019-01-04 LAB — C-REACTIVE PROTEIN: CRP: 6.8 mg/dL — ABNORMAL HIGH (ref <1.0)

## 2019-01-04 NOTE — Progress Notes (Signed)
[facility] PROGRESS NOTE    Progress Note  Troy Lyons  BSJ:[id number] DOB: [birth year] DOA: 01/01/2019 PCP: Shawnie Dapper, PA-C     Brief Narrative:   Troy Lyons is an 38 y.o. male past medical history of MS admitted to the hospital with chief complaint of cough mild extension and shortness of breath diagnosed with COVID-19 and transferred to [facility] for further evaluation.  Assessment/Plan:   Acute respiratory failure with hypoxia (HCC) due to Pneumonia due to COVID-19 virus: Currently satting greater 95% on room air. Continue IV remdesivir steroids vitamin C and zinc. Gwyndolyn Kaufman markers continue to improve.  MS (multiple sclerosis) (HCC): He will continue to work with physical therapy. Continue Tysabri.  Macular papular rash: Now resolved continue Benadryl and steroid cream. Also continue oral steroids. It is now resolved.   DVT prophylaxis: lovenox Family Communication:none Disposition Plan/Barrier to D/C: Home once he completes his remdesivir treatment. Code Status:     Code Status Orders  (From admission, onward)         Start     Ordered   01/01/19 1305  Full code  Continuous     01/01/19 1304        Code Status History    This patient has a current code status but no historical code status.   Advance Care Planning Activity        IV Access:    Peripheral IV   Procedures and diagnostic studies:   No results found.   Medical Consultants:    None.  Anti-Infectives:   IV remdesivir.  Subjective:    Troy Lyons relates at room air feels great, he relates his breathing is significantly better than yesterday  Objective:    Vitals:   01/03/19 1532 01/03/19 2045 01/04/19 0510 01/04/19 0700  BP: 120/70 122/77 128/66 117/70  Pulse: 65 (!) 59 64 (!) 54  Resp: 18 18 18 18   Temp: 98.3 F (36.8 C) 98 F (36.7 C) 98.8 F (37.1 C) 98.6 F (37 C)  TempSrc: Oral Oral Oral Oral  SpO2: 100% 99% 100% 94%  Weight:       Height:       SpO2: 94 % O2 Flow Rate (L/min): 2 L/min(placed on 02@2l /m)   Intake/Output Summary (Last 24 hours) at 01/04/2019 0807 Last data filed at 01/03/2019 1535 Gross per 24 hour  Intake 480 ml  Output -  Net 480 ml   Filed Weights   01/01/19 0229 01/01/19 1121  Weight: 112.5 kg 114.8 kg    Exam: General exam: In no acute distress. Respiratory system: Good air movement and clear to auscultation. Cardiovascular system: S1 & S2 heard, RRR. No JVD. Gastrointestinal system: Abdomen is nondistended, soft and nontender.  Central nervous system: Alert and oriented. No focal neurological deficits. Extremities: No pedal edema. Skin: No rashes, lesions or ulcers Psychiatry: Judgement and insight appear normal. Mood & affect appropriate.     Data Reviewed:    Labs: Basic Metabolic Panel: Recent Labs  Lab 12/30/18 1045 01/01/19 0255 01/01/19 1345 01/02/19 0235 01/03/19 0622 01/04/19 0240  NA 139 138  --  139 141 137  K 3.6 3.3*  --  4.0 4.0 4.2  CL 107 105  --  107 109 108  CO2 22 23  --  24 23 20*  GLUCOSE 120* 96  --  94 106* 136*  BUN 14 18  --  17 16 18   CREATININE 1.03 1.37* 1.11 0.91 0.82 0.82  CALCIUM 8.5*  8.4*  --  8.2* 8.8* 8.8*  MG  --   --   --  1.8 1.8 1.9   GFR Estimated Creatinine Clearance: 170.2 mL/min (by C-G formula based on SCr of 0.82 mg/dL). Liver Function Tests: Recent Labs  Lab 12/30/18 1045 01/01/19 0255 01/02/19 0235 01/03/19 0622 01/04/19 0240  AST 23 24 25 24 25   ALT 39 36 30 31 36  ALKPHOS 61 57 51 53 58  BILITOT 1.1 1.4* 1.3* 1.0 0.6  PROT 7.0 6.9 6.7 6.5 6.5  ALBUMIN 3.9 4.0 3.6 3.3* 3.3*   No results for input(s): LIPASE, AMYLASE in the last 168 hours. No results for input(s): AMMONIA in the last 168 hours. Coagulation profile No results for input(s): INR, PROTIME in the last 168 hours. COVID-19 Labs  Recent Labs    01/02/19 0235 01/03/19 0622 01/04/19 0240  DDIMER 0.57* 0.38 0.30  CRP 16.6* 12.3* 6.8*     Lab Results  Component Value Date   SARSCOV2NAA Not Detected 12/13/2018    CBC: Recent Labs  Lab 12/30/18 1045 01/01/19 0255 01/01/19 1345 01/02/19 0235 01/03/19 0622 01/04/19 0240  WBC 5.7 6.6 5.8 7.3 5.2 4.5  NEUTROABS 3.7 4.0  --  5.7 3.3 2.8  HGB 13.8 13.8 14.1 13.6 13.4 13.3  HCT 40.5 40.9 41.6 40.4 39.3 38.8*  MCV 92.7 93.2 92.7 92.9 90.8 90.7  PLT 107* 112* 114* 124* 133* 160   Cardiac Enzymes: No results for input(s): CKTOTAL, CKMB, CKMBINDEX, TROPONINI in the last 168 hours. BNP (last 3 results) No results for input(s): PROBNP in the last 8760 hours. CBG: No results for input(s): GLUCAP in the last 168 hours. D-Dimer: Recent Labs    01/03/19 0622 01/04/19 0240  DDIMER 0.38 0.30   Hgb A1c: No results for input(s): HGBA1C in the last 72 hours. Lipid Profile: No results for input(s): CHOL, HDL, LDLCALC, TRIG, CHOLHDL, LDLDIRECT in the last 72 hours. Thyroid function studies: No results for input(s): TSH, T4TOTAL, T3FREE, THYROIDAB in the last 72 hours.  Invalid input(s): FREET3 Anemia work up: No results for input(s): VITAMINB12, FOLATE, FERRITIN, TIBC, IRON, RETICCTPCT in the last 72 hours. Sepsis Labs: Recent Labs  Lab 01/01/19 0232 01/01/19 0255 01/01/19 1345 01/02/19 0235 01/03/19 0622 01/04/19 0240  PROCALCITON  --  <0.10  --   --   --   --   WBC  --  6.6 5.8 7.3 5.2 4.5  LATICACIDVEN 1.0  --   --   --   --   --    Microbiology Recent Results (from the past 240 hour(s))  Blood Culture (routine x 2)     Status: None (Preliminary result)   Collection Time: 01/01/19  2:55 AM   Specimen: Right Antecubital; Blood  Result Value Ref Range Status   Specimen Description RIGHT ANTECUBITAL  Final   Special Requests   Final    BOTTLES DRAWN AEROBIC AND ANAEROBIC Blood Culture adequate volume   Culture   Final    NO GROWTH 3 DAYS Performed at [facility], [address] [place], [place] [address]    Report Status PENDING  Incomplete  Blood  Culture (routine x 2)     Status: None (Preliminary result)   Collection Time: 01/01/19  3:27 AM   Specimen: Left Antecubital; Blood  Result Value Ref Range Status   Specimen Description LEFT ANTECUBITAL  Final   Special Requests   Final    BOTTLES DRAWN AEROBIC AND ANAEROBIC Blood Culture adequate volume   Culture  Final    NO GROWTH 3 DAYS Performed at [facility], [address], [place], [place] [address]    Report Status PENDING  Incomplete     Medications:   . albuterol  2 puff Inhalation Q6H  . baclofen  10 mg Oral TID  . cholecalciferol  2,000 Units Oral Daily  . dexamethasone (DECADRON) injection  6 mg Intravenous Q24H  . dextromethorphan-guaiFENesin  1 tablet Oral BID  . docusate sodium  100 mg Oral Daily  . enoxaparin (LOVENOX) injection  40 mg Subcutaneous Q24H  . fluticasone  1 spray Each Nare Daily  . gabapentin  300 mg Oral BID  . gabapentin  600 mg Oral BID  . montelukast  10 mg Oral q morning - 10a  . topiramate  50 mg Oral BID  . vitamin C  500 mg Oral Daily  . zinc sulfate  220 mg Oral Daily   Continuous Infusions: . sodium chloride 100 mL/hr at 01/01/19 1419  . remdesivir 100 mg in NS 250 mL 100 mg (01/03/19 1153)      LOS: 3 days   Marinda Elk  [facility]  01/04/2019, 8:07 AM

## 2019-01-04 NOTE — Progress Notes (Signed)
Occupational Therapy Treatment Patient Details Name: Troy Lyons MRN: [id number] DOB: November 21, 1980 Today's Date: 01/04/2019    History of present illness 38 y.o. male with medical history significant of asthma and multiple sclerosis, began having shortness of breath, cough and fever approximately 1 week ago.  He tested positive for COVID-19 on 11/9; emergency room on 11/14, received a dose of Decadron and albuterol treatment and discharged home; 11/16 to ED with drop in sats and early pna and admitted.    OT comments  Pt able to verbalize and demonstrate adherence to breathing exercises. Pt able to ambulate around room and unit reporting increased SOB with activity. Pt's O2 SATs remained in 90s throughout with 2/4 DOE. Pt verbalized adherence to pursed lip breathing exercises and theraband HEP. Reviewed energy conservation strategies and activity modifications with good understanding and recall. Pt continues to report no anxiety with SOB. Pt has met all OT goals with pt to be discharged from OT services. Pt safe to discharge home from an OT standpoint with no OT follow up.   Follow Up Recommendations  No OT follow up    Equipment Recommendations  (Pt reports that he can obtain a shower chair from family)    Recommendations for Other Services      Precautions / Restrictions Precautions Precautions: None       Mobility Bed Mobility               General bed mobility comments: Pt sitting up in chair upon arrival  Transfers Overall transfer level: Independent                    Balance Overall balance assessment: Independent                                         ADL either performed or assessed with clinical judgement   ADL Overall ADL's : Modified independent(requires increased time to complete tasks)                                     Functional mobility during ADLs: Independent General ADL Comments: Pt sat to don shoes.  Pt states "I felt woozy after taking a shower earlier."Educated pt on energy conservation strategies,      Manufacturing systems engineer      Cognition Arousal/Alertness: Awake/alert Behavior During Therapy: WFL for tasks assessed/performed Overall Cognitive Status: Within Functional Limits for tasks assessed                                          Exercises Exercises: Other exercises Other Exercises Other Exercises: Flutter valve x 10 Other Exercises: Incentive spirometer x 10   Shoulder Instructions       General Comments      Pertinent Vitals/ Pain       Pain Assessment: Faces Faces Pain Scale: Hurts a little bit Pain Location: chest from coughing Pain Descriptors / Indicators: Sore Pain Intervention(s): Monitored during session  Home Living  Prior Functioning/Environment              Frequency           Progress Toward Goals  OT Goals(current goals can now be found in the care plan section)  Progress towards OT goals: Goals met/education completed, patient discharged from OT  ADL Goals Additional ADL Goal #1: Pt will independently verbalize 3 energy conservation strategies(Goal met) Additional ADL Goal #2: Pt will independently demosntrate breathing exercises to improve activity tolerance for ADL tasks(Goal met)  Plan All goals met and education completed, patient discharged from OT services    Co-evaluation                 AM-PAC OT "6 Clicks" Daily Activity     Outcome Measure   Help from another person eating meals?: None Help from another person taking care of personal grooming?: None Help from another person toileting, which includes using toliet, bedpan, or urinal?: None Help from another person bathing (including washing, rinsing, drying)?: None Help from another person to put on and taking off regular upper body clothing?: None Help from another  person to put on and taking off regular lower body clothing?: None 6 Click Score: 24    End of Session    OT Visit Diagnosis: Muscle weakness (generalized) (M62.81) Pain - part of body: (chest)   Activity Tolerance Patient tolerated treatment well   Patient Left in chair;with call bell/phone within reach   Nurse Communication          Time: 0998-3382 OT Time Calculation (min): 25 min  Charges: OT Treatments $Therapeutic Activity: 8-22 mins $Therapeutic Exercise: 8-22 mins  Mauri Brooklyn OTR/L   Kandra Nicolas 01/04/2019, 10:26 AM

## 2019-01-05 DIAGNOSIS — R0602 Shortness of breath: Secondary | ICD-10-CM

## 2019-01-05 LAB — CBC WITH DIFFERENTIAL/PLATELET
Abs Immature Granulocytes: 0.17 10*3/uL — ABNORMAL HIGH (ref 0.00–0.07)
Basophils Absolute: 0 10*3/uL (ref 0.0–0.1)
Basophils Relative: 0 %
Eosinophils Absolute: 0 10*3/uL (ref 0.0–0.5)
Eosinophils Relative: 0 %
HCT: 38.8 % — ABNORMAL LOW (ref 39.0–52.0)
Hemoglobin: 13.3 g/dL (ref 13.0–17.0)
Immature Granulocytes: 3 %
Lymphocytes Relative: 24 %
Lymphs Abs: 1.3 10*3/uL (ref 0.7–4.0)
MCH: 31 pg (ref 26.0–34.0)
MCHC: 34.3 g/dL (ref 30.0–36.0)
MCV: 90.4 fL (ref 80.0–100.0)
Monocytes Absolute: 0.5 10*3/uL (ref 0.1–1.0)
Monocytes Relative: 10 %
Neutro Abs: 3.3 10*3/uL (ref 1.7–7.7)
Neutrophils Relative %: 63 %
Platelets: 185 10*3/uL (ref 150–400)
RBC: 4.29 MIL/uL (ref 4.22–5.81)
RDW: 12.8 % (ref 11.5–15.5)
WBC: 5.2 10*3/uL (ref 4.0–10.5)
nRBC: 1.9 % — ABNORMAL HIGH (ref 0.0–0.2)

## 2019-01-05 LAB — COMPREHENSIVE METABOLIC PANEL
ALT: 46 U/L — ABNORMAL HIGH (ref 0–44)
AST: 30 U/L (ref 15–41)
Albumin: 3.3 g/dL — ABNORMAL LOW (ref 3.5–5.0)
Alkaline Phosphatase: 58 U/L (ref 38–126)
Anion gap: 10 (ref 5–15)
BUN: 19 mg/dL (ref 6–20)
CO2: 22 mmol/L (ref 22–32)
Calcium: 8.7 mg/dL — ABNORMAL LOW (ref 8.9–10.3)
Chloride: 107 mmol/L (ref 98–111)
Creatinine, Ser: 0.88 mg/dL (ref 0.61–1.24)
GFR calc Af Amer: >60 mL/min (ref >60)
GFR calc non Af Amer: >60 mL/min (ref >60)
Glucose, Bld: 143 mg/dL — ABNORMAL HIGH (ref 70–99)
Potassium: 4.2 mmol/L (ref 3.5–5.1)
Sodium: 139 mmol/L (ref 135–145)
Total Bilirubin: 0.7 mg/dL (ref 0.3–1.2)
Total Protein: 6.4 g/dL — ABNORMAL LOW (ref 6.5–8.1)

## 2019-01-05 LAB — BRAIN NATRIURETIC PEPTIDE: B Natriuretic Peptide: 45.7 pg/mL (ref 0.0–100.0)

## 2019-01-05 LAB — C-REACTIVE PROTEIN: CRP: 3.3 mg/dL — ABNORMAL HIGH (ref <1.0)

## 2019-01-05 LAB — D-DIMER, QUANTITATIVE: D-Dimer, Quant: 1.71 ug/mL-FEU — ABNORMAL HIGH (ref 0.00–0.50)

## 2019-01-05 LAB — MAGNESIUM: Magnesium: 1.9 mg/dL (ref 1.7–2.4)

## 2019-01-05 MED ORDER — DEXAMETHASONE 6 MG PO TABS: 6.0000 mg | ORAL_TABLET | Freq: Every day | ORAL | 0 refills | Status: DC

## 2019-01-05 MED ORDER — HYDROCORTISONE 1 % EX CREA: TOPICAL_CREAM | Freq: Four times a day (QID) | CUTANEOUS | 0 refills | Status: DC | PRN

## 2019-01-05 NOTE — Discharge Summary (Signed)
Physician Discharge Summary  Troy Lyons JJK:[id number] DOB: September 10, 1980 DOA: 01/01/2019  PCP: Cory Munch, PA-C  Admit date: 01/01/2019 Discharge date: 01/05/2019  Admitted From: Home Disposition:  Home  Recommendations for Outpatient Follow-up:  1. Follow up with PCP in 1-2 weeks 2. Please obtain BMP/CBC in one week   Home Health:No Equipment/Devices:None  Discharge Condition:Stable CODE STATUS:Full Diet recommendation: Heart Healthy   Brief/Interim Summary: 38 y.o. male past medical history of MS admitted to the hospital with chief complaint of cough mild extension and shortness of breath diagnosed with COVID-19 and transferred to [facility] for further evaluation.  Discharge Diagnoses:  Active Problems:   Pneumonia due to COVID-19 virus   MS (multiple sclerosis) ([place])   Asthma in adult   Acute respiratory failure with hypoxia ([place])  Acute respiratory failure with hypoxia due to pneumonia due to COVID-19 virus: On admission he was started on supplemental oxygen to keep saturations greater than 96%. He was started empirically on IV remdesivir vitamin C and zinc. He was weaned off oxygen to room air. His inflammatory markers improved. He will continue dexamethasone for 5 additional days after discharge due to his maculopapular rash.  MS: Physical therapy was evaluated recommended no home health PT. Continue Tysabri as an outpatient.  Maculopapular rash: Now resolved on Benadryl and steroid creams. He relates that at night it usually flares up, will keep him on dexamethasone for 5 additional days after discharge.  Discharge Instructions  Discharge Instructions    Diet - low sodium heart healthy   Complete by: As directed    Increase activity slowly   Complete by: As directed      Allergies as of 01/05/2019      Reactions   Penicillins Anaphylaxis   Has patient had a PCN reaction causing immediate rash, facial/tongue/throat swelling, SOB or lightheadedness  with hypotension: Yes Has patient had a PCN reaction causing severe rash involving mucus membranes or skin necrosis: No Has patient had a PCN reaction that required hospitalization No Has patient had a PCN reaction occurring within the last 10 years: No If all of the above answers are "NO", then may proceed with Cephalosporin use.   Poison Sumac Extract Anaphylaxis   Severe blistering and breathing problems   Carbamazepine Itching   Fingolimod Hives   rash rash      Medication List    TAKE these medications   acetaminophen 500 MG tablet Commonly known as: TYLENOL Take by mouth every 6 (six) hours as needed for mild pain.   baclofen 10 MG tablet Commonly known as: LIORESAL Take 10 mg by mouth 3 (three) times daily.   benzonatate 200 MG capsule Commonly known as: TESSALON Take 200 mg by mouth 3 (three) times daily as needed for cough.   cholecalciferol 1000 units tablet Commonly known as: VITAMIN D Take 2,000 Units by mouth daily.   dexamethasone 6 MG tablet Commonly known as: DECADRON Take 1 tablet (6 mg total) by mouth daily.   docusate sodium 100 MG capsule Commonly known as: COLACE Take 100 mg by mouth daily as needed for mild constipation.   fluticasone 50 MCG/ACT nasal spray Commonly known as: FLONASE Place 1 spray into both nostrils daily.   gabapentin 300 MG capsule Commonly known as: NEURONTIN Take 300-600 mg by mouth 4 (four) times daily. Take 600mg  in the morning and afternoon; take 300mg  at noon and bedtime   hydrocortisone cream 1 % Apply topically 4 (four) times daily as needed for itching.  levocetirizine 5 MG tablet Commonly known as: XYZAL Take 5 mg by mouth at bedtime.   montelukast 10 MG tablet Commonly known as: SINGULAIR Take 10 mg by mouth every morning.   natalizumab 300 MG/15ML injection Commonly known as: TYSABRI Inject 15 mLs into the vein every 30 (thirty) days.   rizatriptan 10 MG disintegrating tablet Commonly known as:  MAXALT-MLT Take 10 mg by mouth as needed for migraine. May repeat in 2 hours if needed   topiramate 50 MG tablet Commonly known as: TOPAMAX Take 50 mg by mouth 2 (two) times daily.   vitamin C 500 MG tablet Commonly known as: ASCORBIC ACID Take 500 mg by mouth daily.   zinc sulfate 50 MG Caps capsule Take 220 mg by mouth daily.       Allergies  Allergen Reactions  . Penicillins Anaphylaxis    Has patient had a PCN reaction causing immediate rash, facial/tongue/throat swelling, SOB or lightheadedness with hypotension: Yes Has patient had a PCN reaction causing severe rash involving mucus membranes or skin necrosis: No Has patient had a PCN reaction that required hospitalization No Has patient had a PCN reaction occurring within the last 10 years: No If all of the above answers are "NO", then may proceed with Cephalosporin use.   . Poison Sumac Extract Anaphylaxis    Severe blistering and breathing problems  . Carbamazepine Itching  . Fingolimod Hives    rash rash     Consultations:  None   Procedures/Studies: Dg Chest Port 1 View  Result Date: 01/01/2019 CLINICAL DATA:  COVID, shortness of breath EXAM: PORTABLE CHEST 1 VIEW COMPARISON:  12/30/2018 FINDINGS: Heart is normal size. Vague patchy opacities in the lungs again noted, unchanged. No effusions. No acute bony abnormality. IMPRESSION: Vague patchy opacities within the lungs concerning for pneumonia. Electronically Signed   By: Charlett Nose M.D.   On: 01/01/2019 03:25   Dg Chest Portable 1 View  Result Date: 12/30/2018 CLINICAL DATA:  Shortness of breath, COVID positive EXAM: PORTABLE CHEST 1 VIEW COMPARISON:  06/05/2016 FINDINGS: The heart size and mediastinal contours are within normal limits. Subtle bilateral peripheral heterogeneous airspace opacity. The visualized skeletal structures are unremarkable. IMPRESSION: Subtle bilateral peripheral heterogeneous airspace opacity, generally in keeping with reported  diagnosis of COVID-19. Electronically Signed   By: Lauralyn Primes M.D.   On: 12/30/2018 09:59      Subjective: No complaints feels great.  Discharge Exam: Vitals:   01/05/19 0400 01/05/19 0800  BP: (!) 103/58 109/71  Pulse: (!) 45 (!) 55  Resp: 14 16  Temp: 97.9 F (36.6 C)   SpO2: 93% 92%   Vitals:   01/04/19 1100 01/04/19 2000 01/05/19 0400 01/05/19 0800  BP: 113/71 112/65 (!) 103/58 109/71  Pulse: (!) 54 73 (!) 45 (!) 55  Resp: 18 16 14 16   Temp: (!) 97.5 F (36.4 C) 98.7 F (37.1 C) 97.9 F (36.6 C)   TempSrc: Oral  Oral   SpO2: 96% 97% 93% 92%  Weight:      Height:        General: Pt is alert, awake, not in acute distress Cardiovascular: RRR, S1/S2 +, no rubs, no gallops Respiratory: CTA bilaterally, no wheezing, no rhonchi Abdominal: Soft, NT, ND, bowel sounds + Extremities: no edema, no cyanosis    The results of significant diagnostics from this hospitalization (including imaging, microbiology, ancillary and laboratory) are listed below for reference.     Microbiology: Recent Results (from the past 240 hour(s))  Blood  Culture (routine x 2)     Status: None (Preliminary result)   Collection Time: 01/01/19  2:55 AM   Specimen: Right Antecubital; Blood  Result Value Ref Range Status   Specimen Description RIGHT ANTECUBITAL  Final   Special Requests   Final    BOTTLES DRAWN AEROBIC AND ANAEROBIC Blood Culture adequate volume   Culture   Final    NO GROWTH 4 DAYS Performed at [facility], [address] [place], [place] [address]    Report Status PENDING  Incomplete  Blood Culture (routine x 2)     Status: None (Preliminary result)   Collection Time: 01/01/19  3:27 AM   Specimen: Left Antecubital; Blood  Result Value Ref Range Status   Specimen Description LEFT ANTECUBITAL  Final   Special Requests   Final    BOTTLES DRAWN AEROBIC AND ANAEROBIC Blood Culture adequate volume   Culture   Final    NO GROWTH 4 DAYS Performed at [facility], [address] [place], [place] [address]    Report Status PENDING  Incomplete     Labs: BNP (last 3 results) Recent Labs    01/03/19 0622 01/04/19 0240 01/05/19 0211  BNP 78.0 47.1 45.7   Basic Metabolic Panel: Recent Labs  Lab 01/01/19 0255 01/01/19 1345 01/02/19 0235 01/03/19 0622 01/04/19 0240 01/05/19 0211  NA 138  --  139 141 137 139  K 3.3*  --  4.0 4.0 4.2 4.2  CL 105  --  107 109 108 107  CO2 23  --  24 23 20* 22  GLUCOSE 96  --  94 106* 136* 143*  BUN 18  --  17 16 18 19   CREATININE 1.37* 1.11 0.91 0.82 0.82 0.88  CALCIUM 8.4*  --  8.2* 8.8* 8.8* 8.7*  MG  --   --  1.8 1.8 1.9 1.9   Liver Function Tests: Recent Labs  Lab 01/01/19 0255 01/02/19 0235 01/03/19 0622 01/04/19 0240 01/05/19 0211  AST 24 25 24 25 30   ALT 36 30 31 36 46*  ALKPHOS 57 51 53 58 58  BILITOT 1.4* 1.3* 1.0 0.6 0.7  PROT 6.9 6.7 6.5 6.5 6.4*  ALBUMIN 4.0 3.6 3.3* 3.3* 3.3*   No results for input(s): LIPASE, AMYLASE in the last 168 hours. No results for input(s): AMMONIA in the last 168 hours. CBC: Recent Labs  Lab 01/01/19 0255 01/01/19 1345 01/02/19 0235 01/03/19 0622 01/04/19 0240 01/05/19 0211  WBC 6.6 5.8 7.3 5.2 4.5 5.2  NEUTROABS 4.0  --  5.7 3.3 2.8 3.3  HGB 13.8 14.1 13.6 13.4 13.3 13.3  HCT 40.9 41.6 40.4 39.3 38.8* 38.8*  MCV 93.2 92.7 92.9 90.8 90.7 90.4  PLT 112* 114* 124* 133* 160 185   Cardiac Enzymes: No results for input(s): CKTOTAL, CKMB, CKMBINDEX, TROPONINI in the last 168 hours. BNP: Invalid input(s): POCBNP CBG: No results for input(s): GLUCAP in the last 168 hours. D-Dimer Recent Labs    01/04/19 0240 01/05/19 0211  DDIMER 0.30 1.71*   Hgb A1c No results for input(s): HGBA1C in the last 72 hours. Lipid Profile No results for input(s): CHOL, HDL, LDLCALC, TRIG, CHOLHDL, LDLDIRECT in the last 72 hours. Thyroid function studies No results for input(s): TSH, T4TOTAL, T3FREE, THYROIDAB in the last 72 hours.  Invalid input(s): FREET3 Anemia work  up No results for input(s): VITAMINB12, FOLATE, FERRITIN, TIBC, IRON, RETICCTPCT in the last 72 hours. Urinalysis    Component Value Date/Time   COLORURINE YELLOW 08/30/2012  2310   APPEARANCEUR CLEAR 08/30/2012 2310   LABSPEC >1.030 (H) 08/30/2012 2310   PHURINE 6.0 08/30/2012 2310   GLUCOSEU NEGATIVE 08/30/2012 2310   HGBUR SMALL (A) 08/30/2012 2310   BILIRUBINUR NEGATIVE 08/30/2012 2310   KETONESUR NEGATIVE 08/30/2012 2310   PROTEINUR NEGATIVE 08/30/2012 2310   UROBILINOGEN 0.2 08/30/2012 2310   NITRITE NEGATIVE 08/30/2012 2310   LEUKOCYTESUR NEGATIVE 08/30/2012 2310   Sepsis Labs Invalid input(s): PROCALCITONIN,  WBC,  LACTICIDVEN Microbiology Recent Results (from the past 240 hour(s))  Blood Culture (routine x 2)     Status: None (Preliminary result)   Collection Time: 01/01/19  2:55 AM   Specimen: Right Antecubital; Blood  Result Value Ref Range Status   Specimen Description RIGHT ANTECUBITAL  Final   Special Requests   Final    BOTTLES DRAWN AEROBIC AND ANAEROBIC Blood Culture adequate volume   Culture   Final    NO GROWTH 4 DAYS Performed at [facility], [address], [place], [place] [address]    Report Status PENDING  Incomplete  Blood Culture (routine x 2)     Status: None (Preliminary result)   Collection Time: 01/01/19  3:27 AM   Specimen: Left Antecubital; Blood  Result Value Ref Range Status   Specimen Description LEFT ANTECUBITAL  Final   Special Requests   Final    BOTTLES DRAWN AEROBIC AND ANAEROBIC Blood Culture adequate volume   Culture   Final    NO GROWTH 4 DAYS Performed at [facility], [address], [place], [place] [address]    Report Status PENDING  Incomplete     Time coordinating discharge: Over 40 minutes  SIGNED:   Marinda Elk, MD  [facility] 01/05/2019, 9:21 AM Pager   If 7PM-7AM, please contact night-coverage [email or web address] Password [email or web address]

## 2019-01-06 LAB — CULTURE, BLOOD (ROUTINE X 2)
Culture: NO GROWTH
Culture: NO GROWTH
Special Requests: ADEQUATE
Special Requests: ADEQUATE

## 2019-01-16 ENCOUNTER — Other Ambulatory Visit: Payer: Self-pay

## 2019-05-12 ENCOUNTER — Ambulatory Visit
Admission: EM | Admit: 2019-05-12 | Discharge: 2019-05-12 | Disposition: A | Discharge status: Home / Self Care | Attending: Emergency Medicine | Admitting: Emergency Medicine

## 2019-05-12 ENCOUNTER — Other Ambulatory Visit: Payer: Self-pay

## 2019-05-12 ENCOUNTER — Encounter: Payer: Self-pay | Admitting: Emergency Medicine

## 2019-05-12 DIAGNOSIS — L239 Allergic contact dermatitis, unspecified cause: Secondary | ICD-10-CM

## 2019-05-12 MED ORDER — METHYLPREDNISOLONE SODIUM SUCC 125 MG IJ SOLR
125.0000 mg | Freq: Once | INTRAMUSCULAR | Status: AC
  Administered 2019-05-12: 125 mg via INTRAMUSCULAR

## 2019-05-12 MED ORDER — PREDNISONE 10 MG (21) PO TBPK: ORAL_TABLET | ORAL | 0 refills | Status: DC

## 2019-05-12 MED ORDER — TRIAMCINOLONE ACETONIDE 0.1 % EX CREA: 1.0000 "application " | TOPICAL_CREAM | Freq: Two times a day (BID) | CUTANEOUS | 0 refills | Status: DC

## 2019-05-12 NOTE — ED Provider Notes (Signed)
[facility]    CSN: [id number] Arrival date & time: 05/12/19  0093      History   Chief Complaint No chief complaint on file.   HPI Troy Lyons is a 39 y.o. male.   Presented to the urgent care for complaint of rash for the past 1 day.  He denies changes in soaps, detergents, or anyone with similar symptoms.  He localizes the rash to her upper extremities, lower extremities abdomen and head.  He describes it as itchy, red and spreading.  Has not tried any OTC medication.  His symptoms are made worse with heat. He reports similar symptoms in the past that improved with steroid treatment.    The history is provided by the patient. No language interpreter was used.    Past Medical History:  Diagnosis Date  . Asthma   . MS (multiple sclerosis) [facility])     Patient Active Problem List   Diagnosis Date Noted  . Acute respiratory failure with hypoxia ([facility]) 01/03/2019  . Pneumonia due to COVID-19 virus 01/01/2019  . MS (multiple sclerosis) ([facility]) 01/01/2019  . Asthma in adult 01/01/2019    Past Surgical History:  Procedure Laterality Date  . eye damage    . KNEE SURGERY    . nerve damage,         Home Medications    Prior to Admission medications   Medication Sig Start Date End Date Taking? Authorizing Provider  acetaminophen (TYLENOL) 500 MG tablet Take by mouth every 6 (six) hours as needed for mild pain.     [provider]  baclofen (LIORESAL) 10 MG tablet Take 10 mg by mouth 3 (three) times daily.    [provider]  benzonatate (TESSALON) 200 MG capsule Take 200 mg by mouth 3 (three) times daily as needed for cough.  12/13/18   [provider]  cholecalciferol (VITAMIN D) 1000 UNITS tablet Take 2,000 Units by mouth daily.    [provider]  dexamethasone (DECADRON) 6 MG tablet Take 1 tablet (6 mg total) by mouth daily. 01/05/19   Marinda Elk, MD  docusate sodium (COLACE) 100 MG capsule Take 100 mg by  mouth daily as needed for mild constipation.     [provider]  fluticasone (FLONASE) 50 MCG/ACT nasal spray Place 1 spray into both nostrils daily.    [provider]  gabapentin (NEURONTIN) 300 MG capsule Take 300-600 mg by mouth 4 (four) times daily. Take 600mg  in the morning and afternoon; take 300mg  at noon and bedtime    [provider]  hydrocortisone cream 1 % Apply topically 4 (four) times daily as needed for itching. 01/05/19   , MD  levocetirizine (XYZAL) 5 MG tablet Take 5 mg by mouth at bedtime. 12/02/18   [provider]  montelukast (SINGULAIR) 10 MG tablet Take 10 mg by mouth every morning.     [provider]  natalizumab (TYSABRI) 300 MG/15ML injection Inject 15 mLs into the vein every 30 (thirty) days.    [provider]  predniSONE (STERAPRED UNI-PAK 21 TAB) 10 MG (21) TBPK tablet Take 6 tabs by mouth daily  for 2 days, then 5 tabs for 2 days, then 4 tabs for 2 days, then 3 tabs for 2 days, 2 tabs for 2 days, then 1 tab by mouth daily for 2 days 05/12/19   12/04/18, FNP  rizatriptan (MAXALT-MLT) 10 MG disintegrating tablet Take 10 mg by mouth as needed  for migraine. May repeat in 2 hours if needed    [provider]  topiramate (TOPAMAX) 50 MG tablet Take 50 mg by mouth 2 (two) times daily.    [provider]  triamcinolone cream (KENALOG) 0.1 % Apply 1 application topically 2 (two) times daily. 05/12/19   Mell Guia, Zachery Dakins, FNP  vitamin C (ASCORBIC ACID) 500 MG tablet Take 500 mg by mouth daily.    [provider]  zinc sulfate 50 MG CAPS capsule Take 220 mg by mouth daily. 12/25/18   [provider]    Family History Family History  Problem Relation Age of Onset  . Heart failure Mother   . Heart failure Father     Social History Social History   Tobacco Use  . Smoking status: Never Smoker  . Smokeless tobacco: Current User    Types: Chew  Substance  Use Topics  . Alcohol use: No  . Drug use: No     Allergies   Penicillins, Poison sumac extract, Carbamazepine, and Fingolimod   Review of Systems Review of Systems  Constitutional: Negative.   Respiratory: Negative.   Cardiovascular: Negative.   Skin: Positive for color change and rash.  All other systems reviewed and are negative.    Physical Exam Triage Vital Signs ED Triage Vitals  Enc Vitals Group     BP      Pulse      Resp      Temp      Temp src      SpO2      Weight      Height      Head Circumference      Peak Flow      Pain Score      Pain Loc      Pain Edu?      Excl. in GC?    No data found.  Updated Vital Signs BP 122/76 (BP Location: Right Arm)   Pulse 93   Temp 97.7 F (36.5 C)   Resp 17   SpO2 97%   Visual Acuity Right Eye Distance:   Left Eye Distance:   Bilateral Distance:    Right Eye Near:   Left Eye Near:    Bilateral Near:     Physical Exam Vitals and nursing note reviewed.  Constitutional:      General: He is not in acute distress.    Appearance: Normal appearance. He is normal weight. He is not ill-appearing, toxic-appearing or diaphoretic.  HENT:     Head: Normocephalic.     Right Ear: Tympanic membrane, ear canal and external ear normal. There is no impacted cerumen.     Left Ear: Tympanic membrane, ear canal and external ear normal. There is no impacted cerumen.  Cardiovascular:     Rate and Rhythm: Normal rate and regular rhythm.     Pulses: Normal pulses.     Heart sounds: Normal heart sounds. No murmur. No friction rub. No gallop.   Pulmonary:     Effort: Pulmonary effort is normal. No respiratory distress.     Breath sounds: Normal breath sounds. No stridor. No wheezing, rhonchi or rales.  Chest:     Chest wall: No tenderness.  Skin:    General: Skin is warm.     Coloration: Skin is not pale.     Findings: Rash present. No bruising or erythema. Rash is macular. Rash is not scaling.  Neurological:      Mental Status: He is  alert.      UC Treatments / Results  Labs (all labs ordered are listed, but only abnormal results are displayed) Labs Reviewed - No data to display  EKG   Radiology No results found.  Procedures Procedures (including critical care time)  Medications Ordered in UC Medications  methylPREDNISolone sodium succinate (SOLU-MEDROL) 125 mg/2 mL injection 125 mg (has no administration in time range)    Initial Impression / Assessment and Plan / UC Course  I have reviewed the triage vital signs and the nursing notes.  Pertinent labs & imaging results that were available during my care of the patient were reviewed by me and considered in my medical decision making (see chart for details).    Patient is stable for discharge. Solu-Medrol IM was given in office Prednisone was prescribed To continue to take loratadine as prescribed Return for worsening of symptoms  Final Clinical Impressions(s) / UC Diagnoses   Final diagnoses:  Allergic contact dermatitis, unspecified trigger     Discharge Instructions     Prescribed prednisone and triamcinolone cream  Take as prescribed and to completion Limit hot shower and baths, or bathe with warm water.   Moisturize skin daily Follow up with PCP if symptoms persists Return or go to the ER if you have any new or worsening symptoms     ED Prescriptions    Medication Sig Dispense Auth. Provider   predniSONE (STERAPRED UNI-PAK 21 TAB) 10 MG (21) TBPK tablet Take 6 tabs by mouth daily  for 2 days, then 5 tabs for 2 days, then 4 tabs for 2 days, then 3 tabs for 2 days, 2 tabs for 2 days, then 1 tab by mouth daily for 2 days 42 tablet Oron Westrup, Darrelyn Hillock, FNP   triamcinolone cream (KENALOG) 0.1 % Apply 1 application topically 2 (two) times daily. 30 g Emerson Monte, FNP     PDMP not reviewed this encounter.   Emerson Monte, [place] 05/12/19 [phone]

## 2019-05-12 NOTE — Discharge Instructions (Addendum)
Prescribed prednisone and triamcinolone cream Take as prescribed and to completion Limit hot shower and baths, or bathe with warm water.   Moisturize skin daily Follow up with PCP if symptoms persists Return or go to the ER if you have any new or worsening symptoms  

## 2019-05-12 NOTE — ED Triage Notes (Signed)
See by provider prior to this nurse

## 2019-05-16 ENCOUNTER — Other Ambulatory Visit: Payer: Self-pay

## 2019-05-16 ENCOUNTER — Encounter: Payer: Self-pay | Admitting: Allergy

## 2019-05-16 ENCOUNTER — Ambulatory Visit (INDEPENDENT_AMBULATORY_CARE_PROVIDER_SITE_OTHER): Payer: PRIVATE HEALTH INSURANCE | Admitting: Allergy

## 2019-05-16 VITALS — BP 118/60 | HR 67 | Temp 97.2°F | Resp 18 | Ht 75.0 in | Wt 270.4 lb

## 2019-05-16 DIAGNOSIS — J329 Chronic sinusitis, unspecified: Secondary | ICD-10-CM | POA: Insufficient documentation

## 2019-05-16 DIAGNOSIS — J302 Other seasonal allergic rhinitis: Secondary | ICD-10-CM | POA: Diagnosis not present

## 2019-05-16 DIAGNOSIS — J45909 Unspecified asthma, uncomplicated: Secondary | ICD-10-CM | POA: Diagnosis not present

## 2019-05-16 DIAGNOSIS — Z880 Allergy status to penicillin: Secondary | ICD-10-CM

## 2019-05-16 DIAGNOSIS — J339 Nasal polyp, unspecified: Secondary | ICD-10-CM

## 2019-05-16 DIAGNOSIS — J3089 Other allergic rhinitis: Secondary | ICD-10-CM

## 2019-05-16 DIAGNOSIS — H1010 Acute atopic conjunctivitis, unspecified eye: Secondary | ICD-10-CM

## 2019-05-16 DIAGNOSIS — L282 Other prurigo: Secondary | ICD-10-CM

## 2019-05-16 MED ORDER — HYDROXYZINE HCL 25 MG PO TABS: 25.0000 mg | ORAL_TABLET | Freq: Three times a day (TID) | ORAL | 1 refills | Status: DC | PRN

## 2019-05-16 MED ORDER — XHANCE 93 MCG/ACT NA EXHU: 2.0000 | INHALANT_SUSPENSION | Freq: Two times a day (BID) | NASAL | 3 refills | Status: DC

## 2019-05-16 MED ORDER — FAMOTIDINE 20 MG PO TABS: 20.0000 mg | ORAL_TABLET | Freq: Two times a day (BID) | ORAL | 3 refills | Status: DC

## 2019-05-16 NOTE — Patient Instructions (Addendum)
Rash:   Finish prednisone taper as prescribed.  Start zyrtec 10mg  1-2 tablets twice a day.  This replaces levocetirizine for now.   Start famotidine 20mg  twice a day.  Continue Singulair (montelukast) 10mg  daily.   Take hydroxyzine 25mg  every 8 hours as needed for the itching.  This replaces Benadryl and see if it works better.  This may make you tired.   Avoid red meat for now.  Find out what antibiotics you were given as concerning if that may have triggered the hives.   Follow up in 4 weeks.  If you can, try to stop the zyrtec and famotidine 3 days before the next appointment. If the hives return then restart the medications.   If the hives are persistent then will get some bloodwork at the next visit.   Shortness of breath:  May use albuterol rescue inhaler 2 puffs or Symbicort 160 1-2 puffs to 6 hours as needed for shortness of breath, chest tightness, coughing, and wheezing. May use albuterol rescue inhaler 2 puffs 5 to 15 minutes prior to strenuous physical activities. Monitor frequency of use.   Nasal congestion:  Start Xhance 2 sprays per nostril twice a day. This replaces Flonase for now.  Start environmental control measures.  Read about Dupixent injections.  If you are able to stop zyrtec and Pepcid then will do skin testing at next visit.   Monitor infections - may need to get some basic immune bloodwork at the next visit.  Follow up in 4 weeks or sooner if needed.   Reducing Pollen Exposure . Pollen seasons: trees (spring), grass (summer) and ragweed/weeds (fall). Keep windows closed in your home and car to lower pollen exposure.  air conditioning in the bedroom and throughout the house if possible.  . Avoid going out in dry windy days - especially early morning. . Pollen counts are highest between 5 - 10 AM and on dry, hot and windy days.  . Save outside activities for late afternoon or after a heavy rain, when pollen levels are lower.   . Avoid mowing of grass if you have grass pollen allergy. 02-20-1980 Be aware that pollen can also be transported indoors on people and pets.  . Dry your clothes in an automatic dryer rather than hanging them outside where they might collect pollen.  . Rinse hair and eyes before bedtime.

## 2019-05-16 NOTE — Progress Notes (Signed)
New Patient Note  RE: Troy Lyons MRN: [id number] DOB: [birth year] Date of Office Visit: 05/16/2019  Referring provider: Samuella Bruin Primary care provider: Shawnie Dapper, PA-C  Chief Complaint: Urticaria and Sinusitis  History of Present Illness: I had the pleasure of seeing Troy Lyons for initial evaluation at the [facility] on 05/17/2019. He is a 39 y.o. male, who is referred here by Shawnie Dapper, PA-C for the evaluation of chronic sinusitis but now having issues with rash for the past 2 weeks.  Rhinitis: He reports symptoms of sinus pressure, nasal congestion, sneezing, rhinorrhea, itchy/watery eyes, PND. Symptoms have been going on for 3-4 years. The symptoms are present all year around with worsening in spring and summer. Other triggers include exposure to unknown. Anosmia: sometimes. Headache: yes. He has used Claritin, zyrtec, levocetirizine, montelukast, Flonase 1 spray QD, nettipot with minimal improvement in symptoms. Sinus infections: 7-8 the past year and antibiotics sometimes help. Previous work up includes: not recently - done at age 77.  04/27/2019 bloodwork was positive to dust mites, dog, grass, mold, tree, weed, ragweed. Eosinophils 500. Previous ENT evaluation: sinus surgery 2 years ago. Previous sinus imaging: not recently. History of nasal polyps: yes. Last eye exam: 3-4 years ago. History of reflux: yes on omeprazole 40mg  once a day.  Rash: Rash started about 2 weeks ago. Mainly occurs on his arms, legs. Describes them as pruritic, red, raised. Individual rashes lasts about few hours. No ecchymosis upon resolution. Associated symptoms include: unsure. Suspected triggers are unknown. Denies any fevers, chills, foods, personal care products. He has tried the following therapies: benadryl with some benefit. Systemic steroids - 2 injections which helped for a few days then the rash reappeared. Currently on benadryl 50-100mg  and  is currently on prednisone tapering dose - started at 60mg  and today took 50mg .  Previous work up includes: none. Previous history of rash/hives: yes similar rash as today after he had Covid-19 in October 2020 which resolved in a timely manner.  Patient is up to date with the following cancer screening tests: physical exam.  Patient was being treated for a sinus infection and took 4 doses of antibiotics before the rash appeared. He is not sure what antibiotics he took. This was about 2 weeks ago.      Assessment and Plan: Helmer is a 39 y.o. male with: Seasonal and perennial allergic rhinoconjunctivitis Perennial rhinoconjunctivitis symptoms for the past 4 years with worsening in the spring and summer. Tried Claritin, Zyrtec, Xyzal, montelukast, Flonase and Nettie pot with minimal benefit.  Usually gets 7-8 sinus infections per year.  Sinus surgery 2 years ago.  History of nasal polyps.  2021 blood work was positive to dust mites, dog, grass, mold, tree, weed, ragweed.  Eosinophils were 500.  Unable to skin test today due to recent antihistamine intake.  Start Xhance 2 sprays per nostril twice a day. This replaces Flonase for now.  Sample given and demonstrated proper use.  Start environmental control measures.  Consider skin prick testing if able to come off antihistamines at next visit.   Given history of frequent sinusitis and nasal polyps, would be a good candidate for allergy immunotherapy.   Nasal polyp History of nasal polyp and visible right nasal polyp on physical exam today.  Start Xhance 2 spray twice a day as above.  Gave handout on Dupixent - this may be a good option if nasal spray is ineffective for the polyp. Will discuss  further at next visit.   Frequent sinus infections 7-8 sinus infections per year requiring antibiotics. No previous immune evaluation. Patient has MS and takes Tysabri once a month.   Get basic immune bloodwork at next visit.   Keep track of  infections.   Pruritic rash Pruritic rash on upper and lower extremities b/l for the past 2 weeks. Had 2 steroid injections which help for a few days but then it flares back up and now on steroid tapering dose. Took unknown antibiotics for 2 days before onset. He states this rash looks similar to the covid rash he had in October 2020.   Based on clinical history, there are a few potential triggers including the antibiotics or infection.   Finish prednisone taper as prescribed. No need for another steroid injection as he had 2 rounds already.   Start zyrtec 10mg  1-2 tablets twice a day.  This replaces levocetirizine for now.   Start famotidine 20mg  twice a day.  Continue Singulair (montelukast) 10mg  daily.   Take hydroxyzine 25mg  every 8 hours as needed for the itching.  This replaces Benadryl and see if it works better.  Avoid red meat for now.  Find out what antibiotics you were given as concerning if that may have triggered the rash.   Follow up in 4 weeks - stop the zyrtec and famotidine 3 days before the next appointment. If the rash return then restart the medications.   If the rash is persistent then will get some bloodwork at the next visit.   Asthma Diagnosed with asthma as a child and using Symbicort 160 1 puff as a rescue inhaler about 1-2 times a week. Using albuterol less than once a week.   Today's spirometry showed: normal pattern with no significant improvement in FEV1 post bronchodilator treatment however patient clinically feeling improved.  May use albuterol rescue inhaler 2 puffs or nebulizer every 4 to 6 hours as needed for shortness of breath, chest tightness, coughing, and wheezing. May use albuterol rescue inhaler 2 puffs 5 to 15 minutes prior to strenuous physical activities. Monitor frequency of use.   Penicillin allergy Reaction over 10+ years ago in the form of throat tightness and swelling.  Continue to avoid.  Consider penicillin skin testing in  future.   Return in about 4 weeks (around 06/13/2019).  Meds ordered this encounter  Medications  . famotidine (PEPCID) 20 MG tablet    Sig: Take 1 tablet (20 mg total) by mouth 2 (two) times daily.    Dispense:  60 tablet    Refill:  3  . hydrOXYzine (ATARAX/VISTARIL) 25 MG tablet    Sig: Take 1 tablet (25 mg total) by mouth every 8 (eight) hours as needed for itching.    Dispense:  90 tablet    Refill:  1  . Fluticasone Propionate (XHANCE) 93 MCG/ACT EXHU    Sig: Place 2 sprays into the nose in the morning and at bedtime.    Dispense:  32 mL    Refill:  3   Other allergy screening: Asthma: yes  He reports symptoms of chest tightness, shortness of breath, coughing, wheezing for 30 years. Current medications include Symbicort 160 1 puff prn and albuterol prn which help. He reports not using aerochamber with inhalers. He tried the following inhalers: none. Main triggers are allergies. In the last month, frequency of symptoms: 1-2x/week. Frequency of nocturnal symptoms: 0x/month. Frequency of SABA use: <1x/week. Interference with physical activity: no. Sleep is undisturbed. In the last 12  months, emergency room visits/urgent care visits/doctor office visits or hospitalizations due to respiratory issues: 0. In the last 12 months, oral steroids courses: 2 for the hives. Lifetime history of hospitalization for respiratory issues: yes as a teenager. Prior intubations: 0. Asthma was diagnosed as a child. History of pneumonia: yes 5-6 years. He was evaluated by allergist in the past. Smoking exposure: denies. Up to date with flu vaccine: yes.  Food allergy: no Medication allergy: yes  Penicillin - throat tightness and swelling over 10-15 years ago Hymenoptera allergy: no Eczema:no History of recurrent infections suggestive of immunodeficency:  Patient has MS and takes Tysabri once a month. Patient has history multiple infections including sinus infection, pneumonia. Denies any ear infections, GI  infections/diarrhea, skin infections/abscesses. Patient has no history of opportunistic infections including fungal infections, viral infections.   Patient reports 6-7 antibiotic use in the last 12 months and 0 hospital admissions. Patient does not have any secondary causes of immunodeficiency including chronic steroid use, diabetes mellitus, protein losing enteropathy, renal or hepatic dysfunction, history of cancer or irradiation or history of HIV, hepatitis B or C.  Diagnostics: Spirometry:  Tracings reviewed. His effort: Good reproducible efforts. FVC:5.22 L FEV1: 3.66L, 73% predicted FEV1/FVC ratio: 70% Interpretation: Spirometry consistent with normal pattern with no significant improvement in FEV1 post bronchodilator treatment however patient clinically feeling improved. Please see scanned spirometry results for details.  Skin Testing: Deferred due to recent antihistamines use.  Past Medical History: Patient Active Problem List   Diagnosis Date Noted  . Frequent sinus infections 05/16/2019  . Nasal polyp 05/16/2019  . Pruritic rash 05/16/2019  . Penicillin allergy 05/16/2019  . Asthma 05/16/2019  . Seasonal and perennial allergic rhinoconjunctivitis 05/16/2019  . Acute respiratory failure with hypoxia ([place]) 01/03/2019  . Pneumonia due to COVID-19 virus 01/01/2019  . MS (multiple sclerosis) ([place]) 01/01/2019   Past Medical History:  Diagnosis Date  . Asthma   . MS (multiple sclerosis) ([place])   . Urticaria    Past Surgical History: Past Surgical History:  Procedure Laterality Date  . eye damage    . KNEE SURGERY    . nerve damage,     Medication List:  Current Outpatient Medications  Medication Sig Dispense Refill  . acetaminophen (TYLENOL) 500 MG tablet Take by mouth every 6 (six) hours as needed for mild pain.     . baclofen (LIORESAL) 10 MG tablet Take 10 mg by mouth 3 (three) times daily.    . benzonatate (TESSALON) 200 MG capsule Take 200 mg by mouth 3 (three)  times daily as needed for cough.     . cholecalciferol (VITAMIN D) 1000 UNITS tablet Take 2,000 Units by mouth daily.    Marland Kitchen dexamethasone (DECADRON) 6 MG tablet Take 1 tablet (6 mg total) by mouth daily. 5 tablet 0  . docusate sodium (COLACE) 100 MG capsule Take 100 mg by mouth daily as needed for mild constipation.     . fluticasone (FLONASE) 50 MCG/ACT nasal spray Place 1 spray into both nostrils daily.    Marland Kitchen gabapentin (NEURONTIN) 300 MG capsule Take 300-600 mg by mouth 4 (four) times daily. Take 600mg  in the morning and afternoon; take 300mg  at noon and bedtime    . hydrocortisone cream 1 % Apply topically 4 (four) times daily as needed for itching. 30 g 0  . levocetirizine (XYZAL) 5 MG tablet Take 5 mg by mouth at bedtime.    . montelukast (SINGULAIR) 10 MG tablet Take 10 mg by mouth every  morning.     . natalizumab (TYSABRI) 300 MG/15ML injection Inject 15 mLs into the vein every 30 (thirty) days.    . predniSONE (STERAPRED UNI-PAK 21 TAB) 10 MG (21) TBPK tablet Take 6 tabs by mouth daily  for 2 days, then 5 tabs for 2 days, then 4 tabs for 2 days, then 3 tabs for 2 days, 2 tabs for 2 days, then 1 tab by mouth daily for 2 days 42 tablet 0  . rizatriptan (MAXALT-MLT) 10 MG disintegrating tablet Take 10 mg by mouth as needed for migraine. May repeat in 2 hours if needed    . thiamine (VITAMIN B-1) 50 MG tablet Take 50 mg by mouth daily.    Marland Kitchen topiramate (TOPAMAX) 50 MG tablet Take 50 mg by mouth 2 (two) times daily.    Marland Kitchen triamcinolone cream (KENALOG) 0.1 % Apply 1 application topically 2 (two) times daily. 30 g 0  . vitamin C (ASCORBIC ACID) 500 MG tablet Take 500 mg by mouth daily.    Marland Kitchen zinc sulfate 50 MG CAPS capsule Take 220 mg by mouth daily.    . famotidine (PEPCID) 20 MG tablet Take 1 tablet (20 mg total) by mouth 2 (two) times daily. 60 tablet 3  . Fluticasone Propionate (XHANCE) 93 MCG/ACT EXHU Place 2 sprays into the nose in the morning and at bedtime. 32 mL 3  . hydrOXYzine  (ATARAX/VISTARIL) 25 MG tablet Take 1 tablet (25 mg total) by mouth every 8 (eight) hours as needed for itching. 90 tablet 1   No current facility-administered medications for this visit.   Allergies: Allergies  Allergen Reactions  . Penicillins Anaphylaxis    Has patient had a PCN reaction causing immediate rash, facial/tongue/throat swelling, SOB or lightheadedness with hypotension: Yes Has patient had a PCN reaction causing severe rash involving mucus membranes or skin necrosis: No Has patient had a PCN reaction that required hospitalization No Has patient had a PCN reaction occurring within the last 10 years: No If all of the above answers are "NO", then may proceed with Cephalosporin use.   . Poison Sumac Extract Anaphylaxis    Severe blistering and breathing problems  . Carbamazepine Itching  . Fingolimod Hives    rash rash    Social History: Social History   Socioeconomic History  . Marital status: Married    Spouse name: Not on file  . Number of children: Not on file  . Years of education: Not on file  . Highest education level: Not on file  Occupational History  . Not on file  Tobacco Use  . Smoking status: Never Smoker  . Smokeless tobacco: Current User    Types: Chew  Substance and Sexual Activity  . Alcohol use: Yes    Comment: SOMETIMES  . Drug use: No  . Sexual activity: Not on file  Other Topics Concern  . Not on file  Social History Narrative  . Not on file   Social Determinants of Health   Financial Resource Strain:   . Difficulty of Paying Living Expenses:   Food Insecurity:   . Worried About Programme researcher, broadcasting/film/video in the Last Year:   . Barista in the Last Year:   Transportation Needs:   . Freight forwarder (Medical):   Marland Kitchen Lack of Transportation (Non-Medical):   Physical Activity:   . Days of Exercise per Week:   . Minutes of Exercise per Session:   Stress:   . Feeling of Stress :  Social Connections:   . Frequency of  Communication with Friends and Family:   . Frequency of Social Gatherings with Friends and Family:   . Attends Religious Services:   . Active Member of Clubs or Organizations:   . Attends Banker Meetings:   Marland Kitchen Marital Status:    Lives in a house built in 1989. Smoking: denies Occupation: Engineer, drilling HistorySurveyor, minerals in the house: no Engineer, civil (consulting) in the family room: yes Carpet in the bedroom: yes Heating: gas Cooling: central Pet: yes 1 dog x 1 yr  Family History: Family History  Problem Relation Age of Onset  . Heart failure Mother   . Heart failure Father    Problem                               Relation Asthma                                   No  Eczema                                No  Food allergy                          No  Allergic rhino conjunctivitis     Son   Review of Systems  Constitutional: Negative for appetite change, chills, fever and unexpected weight change.  HENT: Positive for congestion, postnasal drip, rhinorrhea, sinus pressure and sneezing.   Eyes: Positive for itching.  Respiratory: Negative for cough, chest tightness, shortness of breath and wheezing.   Cardiovascular: Negative for chest pain.  Gastrointestinal: Negative for abdominal pain.  Genitourinary: Negative for difficulty urinating.  Skin: Positive for rash.  Allergic/Immunologic: Positive for environmental allergies. Negative for food allergies.  Neurological: Positive for headaches.   Objective: BP 118/60 (BP Location: Right Arm, Patient Position: Sitting, Cuff Size: Large)   Pulse 67   Temp (!) 97.2 F (36.2 C) (Temporal)   Resp 18   Ht 6\' 3"  (1.905 m)   Wt 270 lb 6.4 oz (122.7 kg)   SpO2 95%   BMI 33.80 kg/m  Body mass index is 33.8 kg/m. Physical Exam  Constitutional: He is oriented to person, place, and time. He appears well-developed and well-nourished.  HENT:  Head: Normocephalic and atraumatic.  Right Ear: External ear normal.    Left Ear: External ear normal.  Mouth/Throat: Oropharynx is clear and moist.  Right side nasal polyp  Eyes: Conjunctivae and EOM are normal.  Cardiovascular: Normal rate, regular rhythm and normal heart sounds. Exam reveals no gallop and no friction rub.  No murmur heard. Pulmonary/Chest: Effort normal and breath sounds normal. He has no wheezes. He has no rales.  Abdominal: Soft.  Musculoskeletal:     Cervical back: Neck supple.  Neurological: He is alert and oriented to person, place, and time.  Skin: Skin is warm. Rash noted.  Diffuse erythematous circular macular rash on the upper extremities b/l and lower extremities b/l. Some are blanchable and some are not.   Psychiatric: He has a normal mood and affect. His behavior is normal.  Nursing note and vitals reviewed.  The plan was reviewed with the patient/family, and all questions/concerned were addressed.  It was my pleasure to  see Troy Lyons today and participate in his care. Please feel free to contact me with any questions or concerns.  Sincerely,  Wyline Mood, DO Allergy & Immunology  [facility] office: [phone] [facility] office: [phone] [place] office: [phone]

## 2019-05-16 NOTE — Assessment & Plan Note (Signed)
7-8 sinus infections per year requiring antibiotics. No previous immune evaluation. Patient has MS and takes Tysabri once a month.   Get basic immune bloodwork at next visit.   Keep track of infections.

## 2019-05-16 NOTE — Assessment & Plan Note (Addendum)
History of nasal polyp and visible right nasal polyp on physical exam today.  Start Xhance 2 spray twice a day as above.  Gave handout on Dupixent - this may be a good option if nasal spray is ineffective for the polyp. Will discuss further at next visit.

## 2019-05-16 NOTE — Assessment & Plan Note (Addendum)
Perennial rhinoconjunctivitis symptoms for the past 4 years with worsening in the spring and summer. Tried Claritin, Zyrtec, Xyzal, montelukast, Flonase and Nettie pot with minimal benefit.  Usually gets 7-8 sinus infections per year.  Sinus surgery 2 years ago.  History of nasal polyps.  2021 blood work was positive to dust mites, dog, grass, mold, tree, weed, ragweed.  Eosinophils were 500.  Unable to skin test today due to recent antihistamine intake.  Start Xhance 2 sprays per nostril twice a day. This replaces Flonase for now.  Sample given and demonstrated proper use.  Start environmental control measures.  Consider skin prick testing if able to come off antihistamines at next visit.   Given history of frequent sinusitis and nasal polyps, would be a good candidate for allergy immunotherapy.

## 2019-05-17 NOTE — Assessment & Plan Note (Signed)
Diagnosed with asthma as a child and using Symbicort 160 1 puff as a rescue inhaler about 1-2 times a week. Using albuterol less than once a week.   Today's spirometry showed: normal pattern with no significant improvement in FEV1 post bronchodilator treatment however patient clinically feeling improved.  May use albuterol rescue inhaler 2 puffs or nebulizer every 4 to 6 hours as needed for shortness of breath, chest tightness, coughing, and wheezing. May use albuterol rescue inhaler 2 puffs 5 to 15 minutes prior to strenuous physical activities. Monitor frequency of use.

## 2019-05-17 NOTE — Assessment & Plan Note (Signed)
Pruritic rash on upper and lower extremities b/l for the past 2 weeks. Had 2 steroid injections which help for a few days but then it flares back up and now on steroid tapering dose. Took unknown antibiotics for 2 days before onset. He states this rash looks similar to the covid rash he had in October 2020.   Based on clinical history, there are a few potential triggers including the antibiotics or infection.   Finish prednisone taper as prescribed. No need for another steroid injection as he had 2 rounds already.   Start zyrtec 10mg  1-2 tablets twice a day.  This replaces levocetirizine for now.   Start famotidine 20mg  twice a day.  Continue Singulair (montelukast) 10mg  daily.   Take hydroxyzine 25mg  every 8 hours as needed for the itching.  This replaces Benadryl and see if it works better.  Avoid red meat for now.  Find out what antibiotics you were given as concerning if that may have triggered the rash.   Follow up in 4 weeks - stop the zyrtec and famotidine 3 days before the next appointment. If the rash return then restart the medications.   If the rash is persistent then will get some bloodwork at the next visit.

## 2019-05-17 NOTE — Assessment & Plan Note (Signed)
Reaction over 10+ years ago in the form of throat tightness and swelling.  Continue to avoid.  Consider penicillin skin testing in future.

## 2019-05-22 ENCOUNTER — Telehealth: Payer: Self-pay

## 2019-05-22 NOTE — Telephone Encounter (Signed)
Clinical information sent to [facility] for [facility].

## 2019-05-24 NOTE — Telephone Encounter (Signed)
Prior Berkley Harvey is not required per insurance. I have sent this information to [facility] as well as a copy of the cancelled notification for the prior authorization submission to the scan center for future reference.

## 2019-06-29 ENCOUNTER — Ambulatory Visit: Payer: PRIVATE HEALTH INSURANCE | Admitting: Allergy & Immunology

## 2019-07-09 ENCOUNTER — Encounter: Payer: Self-pay | Admitting: Allergy

## 2019-07-13 ENCOUNTER — Ambulatory Visit: Payer: PRIVATE HEALTH INSURANCE | Admitting: Allergy & Immunology

## 2019-07-18 ENCOUNTER — Ambulatory Visit (INDEPENDENT_AMBULATORY_CARE_PROVIDER_SITE_OTHER): Payer: No Typology Code available for payment source | Admitting: Allergy & Immunology

## 2019-07-18 ENCOUNTER — Encounter: Payer: Self-pay | Admitting: Allergy & Immunology

## 2019-07-18 ENCOUNTER — Other Ambulatory Visit: Payer: Self-pay

## 2019-07-18 VITALS — BP 112/78 | HR 72 | Temp 98.0°F | Resp 18 | Ht 75.0 in | Wt 253.0 lb

## 2019-07-18 DIAGNOSIS — J3089 Other allergic rhinitis: Secondary | ICD-10-CM | POA: Diagnosis not present

## 2019-07-18 DIAGNOSIS — J4541 Moderate persistent asthma with (acute) exacerbation: Secondary | ICD-10-CM | POA: Diagnosis not present

## 2019-07-18 DIAGNOSIS — J4540 Moderate persistent asthma, uncomplicated: Secondary | ICD-10-CM

## 2019-07-18 DIAGNOSIS — Z880 Allergy status to penicillin: Secondary | ICD-10-CM

## 2019-07-18 DIAGNOSIS — B999 Unspecified infectious disease: Secondary | ICD-10-CM

## 2019-07-18 DIAGNOSIS — L282 Other prurigo: Secondary | ICD-10-CM

## 2019-07-18 DIAGNOSIS — J302 Other seasonal allergic rhinitis: Secondary | ICD-10-CM

## 2019-07-18 MED ORDER — ALBUTEROL SULFATE HFA 108 (90 BASE) MCG/ACT IN AERS: INHALATION_SPRAY | RESPIRATORY_TRACT | 1 refills | Status: DC

## 2019-07-18 MED ORDER — SYMBICORT 160-4.5 MCG/ACT IN AERO: 2.0000 | INHALATION_SPRAY | Freq: Two times a day (BID) | RESPIRATORY_TRACT | 1 refills | Status: DC

## 2019-07-18 MED ORDER — EPINEPHRINE 0.3 MG/0.3ML IJ SOAJ: 0.3000 mg | Freq: Once | INTRAMUSCULAR | 1 refills | Status: DC | PRN

## 2019-07-18 MED ORDER — DUPILUMAB 300 MG/2ML ~~LOC~~ SOSY
600.0000 mg | PREFILLED_SYRINGE | Freq: Once | SUBCUTANEOUS | Status: AC
  Administered 2019-07-18: 600 mg via SUBCUTANEOUS

## 2019-07-18 NOTE — Patient Instructions (Addendum)
1. Moderate persistent asthma, uncomplicated - Lung testing looks slightly low, but I think that has more to do with the fact that you are using your inhalers incorrectly. - We are starting Dupixent (sample given today). - Start the prednisone pack provided today.  - Daily controller medication(s): Symbicort 160/4.17mcg two puffs twice daily with spacer + Dupixent every two weeks - Prior to physical activity: albuterol 2 puffs 10-15 minutes before physical activity. - Rescue medications: albuterol 4 puffs every 4-6 hours as needed  - Asthma control goals:  * Full participation in all desired activities (may need albuterol before activity) * Albuterol use two time or less a week on average (not counting use with activity) * Cough interfering with sleep two time or less a month * Oral steroids no more than once a year * No hospitalizations  2. Seasonal and perennial allergic rhinitis (dust mites, dog, grass, mold, trees, weeds, and ragweed)  - Continue with Xhance two sprays per nostril twice daily as needed.  - Continue with cetirizine 10mg  twice daily.  3. Recurrent infections - We will defer blood work for now. - If you are continuing to have infections despite control of your nasal polyps, we can do a workup at that time.   4. Penicillin allergy - Consider penicillin testing and challenge in the future.   5. Pruritic rash - Continue with cetirizine as above. - Continue with famotidine 20 mg twice daily.  - Continue with hydroxyzine 25mg  at night.  - We are going to get some labs to see if we can pinpoint the cause of the rash.  - It is clearly not related to that antibiotic.   6. Return in about 2 months (around 09/17/2019). This can be an in-person, a virtual Webex or a telephone follow up visit.   Please inform of any Emergency Department visits, hospitalizations, or changes in symptoms. Call 11/17/2019 before going to the ED for breathing or allergy symptoms since we might be able to  fit you in for a sick visit. Feel free to contact us anytime with any questions, problems, or concerns.  It was a pleasure to meet you today!  Websites that have reliable patient information: 1. American Academy of Asthma, Allergy, and Immunology: www.aaaai.org 2. Food Allergy Research and Education (FARE): foodallergy.org 3. Mothers of Asthmatics: http://www.asthmacommunitynetwork.org 4. American College of Allergy, Asthma, and Immunology: www.acaai.org   COVID-19 Vaccine Information can be found at: [place] For questions related to vaccine distribution or appointments, please email [email or web address] or call [phone].     Like [email or web address] on [phone] and Instagram for our latest updates!       HAPPY SPRING!  Make sure you are registered to vote! If you have moved or changed any of your contact information, you will need to get this updated before voting!  In some cases, you MAY be able to register to vote online: [place]

## 2019-07-18 NOTE — Progress Notes (Addendum)
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 Moderate persistent asthma, uncomplicated - Lung testing looks slightly low, but I think that has more to do with the fact that you are using your inhalers incorrectly. - We are starting Dupixent (sample given today). - I am going to talk to the [facility] for Dupixent to ask about the use of Tysabri and Dupixent together, although they are both such targeted medications that I do not foresee a problem using them concurrently.  - Start the prednisone pack provided today.  - Daily controller medication(s): Symbicort 160/4.61mcg two puffs twice daily with spacer + Dupixent every two weeks - Prior to physical activity: albuterol 2 puffs 10-15 minutes before physical activity. - Rescue medications: albuterol 4 puffs every 4-6 hours as needed  - Asthma control goals:  * Full participation in all desired activities (may need albuterol before activity) * Albuterol use two time or less a week on average (not counting use with activity) * Cough interfering with sleep two time or less a month * Oral steroids no more than once a year * No hospitalizations  2. Seasonal and perennial allergic rhinitis (dust mites, dog, grass, mold, trees, weeds, and ragweed)  - Continue with Xhance two sprays per nostril twice daily as needed.  - Continue with cetirizine 10mg  twice daily.  3. Recurrent infections - We will defer blood work for now. - If you are continuing to have infections despite control of your nasal polyps, we can do a workup at that  time.   4. Penicillin allergy - Consider penicillin testing and challenge in the future.   5. Pruritic rash - Continue with cetirizine as above. - Continue with famotidine 20 mg twice daily.  - Continue with hydroxyzine 25mg  at night.  - We are going to get some labs to see if we can pinpoint the cause of the rash.  - It is clearly not related to that antibiotic.   6. Return in about 2 months (around 09/17/2019). This can be an in-person, a virtual Webex or a telephone follow up visit.   Subjective:   [name] is a 39 y.o. male presenting today for follow up of  Chief Complaint  Patient presents with  . Allergic Rhinitis     symptoms have been pretty bad. 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     Troy Lyons has a history of the following: Patient Active Problem List   Diagnosis Date Noted  . Frequent sinus infections 05/16/2019  . Nasal polyp 05/16/2019  . Pruritic rash 05/16/2019  . Penicillin allergy 05/16/2019  . Asthma 05/16/2019  . Seasonal and perennial allergic rhinoconjunctivitis 05/16/2019  . Acute respiratory failure with hypoxia (HCC) 01/03/2019  . Pneumonia due to COVID-19 virus 01/01/2019  . MS (multiple sclerosis) (HCC) 01/01/2019    History obtained from: chart review and patient.  [name] is a 39 y.o. male presenting for a follow up visit.  He was last seen in March 2021.  At that time, he was there to establish care.  He already had 2021 blood work that was  positive to dust mites, dog, grass, mold, trees, weeds, and ragweed.  He had tried multiple antihistamines without improvement.  He was started on XHANCE 2 sprays per nostril twice daily.  Allergen immunotherapy was briefly discussed.  It was felt that the [facility] addition would help with nasal polyps.  He also has a history of frequent sinus infections.  There was discussion of doing a basic immune work-up at the next visit.  He had a rash on his upper and lower  extremities for the past 2 weeks.  He had had 2 steroid injections with flares after despite improvement right after the injections.  This rash was felt to be similar to the Covid rash that he experienced in October 2020.  He was started on Zyrtec 10 mg 1 to 2 tablets twice daily.  He was also started on famotidine 20 mg twice daily.  Singulair 10 mg was continued.  Asthma/Respiratory Symptom History: Asthma is fairly well controlled. He reports that every year it starts around the allergic season. He has been using the regular albuterol multiple times per day. He uses the Symbicort only PRN. He did have a spacer at his home but he does not use it daily with the albuterol. He does have some recent coughing. He is open to anything that will make his symptoms better, including a biologic.   Allergic Rhinitis Symptom History: He has done sinus surgery for the nasal polyps. He does think that [place] helped at all.  He remains on the cetirizine 2 tablets daily as well as famotidine 2 tablets daily.  He has never been on allergen immunotherapy.  Recurrent Infections Symptom History: He tends to only get sinus infections, but these numbers are 6 to 8 per year.  He does not get any other infections including pneumonias, skin infections, or any infections requiring IV antibiotics.  He has never had an immune work-up.  Dermatitis Symptom History: The rash is slowly getting better.  He does have some excoriations on his bilateral legs.  Otherwise, there have been no changes to his past medical history, surgical history, family history, or social history.    Review of Systems  Constitutional: Negative.  Negative for chills, fever, malaise/fatigue and weight loss.  HENT: Positive for congestion, sinus pain and sore throat. Negative for ear discharge and ear pain.   Eyes: Negative for pain, discharge and redness.  Respiratory: Positive for cough and wheezing. Negative for sputum production and shortness of  breath.   Cardiovascular: Negative.  Negative for chest pain and palpitations.  Gastrointestinal: Negative for abdominal pain, constipation, diarrhea, heartburn, nausea and vomiting.  Skin: Negative.  Negative for itching and rash.  Neurological: Negative for dizziness and headaches.  Endo/Heme/Allergies: Positive for environmental allergies. Does not bruise/bleed easily.       Objective:   Blood pressure 112/78, pulse 72, temperature 98 F (36.7 C), temperature source Temporal, resp. rate 18, height 6\' 3"  (1.905 m), weight 253 lb (114.8 kg), SpO2 97 %. Body mass index is 31.62 kg/m.   Physical Exam:  Physical Exam  Constitutional: He appears well-developed and well-nourished.  Pleasant male. Very courteous.   HENT:  Head: Normocephalic and atraumatic.  Right Ear: Tympanic membrane, external ear and ear canal normal.  Left Ear: Tympanic membrane, external ear and ear canal normal.  Nose: Mucosal edema and rhinorrhea present. No nasal deformity or septal deviation. No epistaxis. Right sinus exhibits no maxillary sinus tenderness and no frontal sinus tenderness. Left sinus exhibits no maxillary sinus tenderness  and no frontal sinus tenderness.  Mouth/Throat: Uvula is midline and oropharynx is clear and moist. Mucous membranes are not pale and not dry.  Large nasal polyp encompassing 100% of the right nasal cavity. There was also a smaller polyps on the left side encompassing 25% of the left nasal cavity.   Eyes: Pupils are equal, round, and reactive to light. Conjunctivae and EOM are normal. Right eye exhibits no chemosis and no discharge. Left eye exhibits no chemosis and no discharge. Right conjunctiva is not injected. Left conjunctiva is not injected.  Cardiovascular: Normal rate, regular rhythm and normal heart sounds.  Respiratory: Effort normal and breath sounds normal. No accessory muscle usage. No tachypnea. No respiratory distress. He has no wheezes. He has no rhonchi. He has  no rales. He exhibits no tenderness.  Inspiratory and expiratory wheezing throughout.  Lymphadenopathy:    He has no cervical adenopathy.  Neurological: He is alert.  Skin: No abrasion, no petechiae and no rash noted. Rash is not papular, not vesicular and not urticarial. No erythema. No pallor.  No eczematous or urticarial lesions noted.  There are some excoriations present on his bilateral legs.  Psychiatric: He has a normal mood and affect.     Diagnostic studies:    Spirometry: results abnormal (FEV1: 4.84/77%, FVC: 68%, FEV1/FVC: 68%).    Spirometry consistent with normal pattern.   Allergy Studies: none       Salvatore Marvel, MD  [facility]

## 2019-07-18 NOTE — Progress Notes (Signed)
Immunotherapy   Patient Details  Name: Troy Lyons MRN: [id number] Date of Birth: [birth year]  07/18/2019  [name] started injections for  Dupixent. Loading dose 600 mg given in office today. Self administration training given to patient. He has self-administered injections before.  Frequency:Every 2 weeks in home.  Epi-Pen:Prescription for Epi-Pen given  Patient waited 30 minutes in office post injection. No local reactions noted or systemic symptoms reported.  Consent signed and patient instructions given.   [name] 07/18/2019, 2:33 PM

## 2019-07-19 ENCOUNTER — Telehealth: Payer: Self-pay | Admitting: *Deleted

## 2019-07-19 NOTE — Telephone Encounter (Signed)
Called patient to advise Dupixent covered under his pharmacy plan, copay card obtained and submit to [facility] specialty pharmacy that will reach out to ship to patient. Advised storage and schedule one syringe every 14 days

## 2019-07-24 ENCOUNTER — Telehealth: Payer: Self-pay | Admitting: *Deleted

## 2019-07-24 MED ORDER — PREDNISONE 10 MG PO TABS: ORAL_TABLET | ORAL | 0 refills | Status: DC

## 2019-07-24 NOTE — Telephone Encounter (Signed)
Patient called office and states he did well after receiving Dupixent on 06/02 and now he has hives/rash again.  Patient would like to know if there is anything else he can add or do to help with hives until he can get next injection.     [facility]

## 2019-07-24 NOTE — Telephone Encounter (Signed)
9 day prednisone taper sent in. Please call to let the patient know. He does not need to come in for that.  Malachi Bonds, MD [facility]

## 2019-07-24 NOTE — Telephone Encounter (Signed)
Patient notified and verbalized understanding. 

## 2019-07-27 ENCOUNTER — Ambulatory Visit (INDEPENDENT_AMBULATORY_CARE_PROVIDER_SITE_OTHER): Payer: No Typology Code available for payment source

## 2019-07-27 ENCOUNTER — Ambulatory Visit: Payer: No Typology Code available for payment source | Admitting: Allergy & Immunology

## 2019-07-27 ENCOUNTER — Other Ambulatory Visit: Payer: Self-pay

## 2019-07-27 DIAGNOSIS — L509 Urticaria, unspecified: Secondary | ICD-10-CM

## 2019-07-27 MED ORDER — METHYLPREDNISOLONE ACETATE 80 MG/ML IJ SUSP
80.0000 mg | Freq: Once | INTRAMUSCULAR | Status: AC
  Administered 2019-07-27: 80 mg via INTRAMUSCULAR

## 2019-08-02 LAB — ALPHA-GAL PANEL
Alpha Gal IgE*: <0.1 kU/L (ref <0.10)
Beef (Bos spp) IgE: <0.1 kU/L (ref <0.35)
Class Interpretation: 0
Class Interpretation: 0
Class Interpretation: 0
Lamb/Mutton (Ovis spp) IgE: <0.1 kU/L (ref <0.35)
Pork (Sus spp) IgE: <0.1 kU/L (ref <0.35)

## 2019-08-02 LAB — CMP14+EGFR
ALT: 56 IU/L — ABNORMAL HIGH (ref 0–44)
AST: 31 IU/L (ref 0–40)
Albumin/Globulin Ratio: 2 (ref 1.2–2.2)
Albumin: 4.7 g/dL (ref 4.0–5.0)
Alkaline Phosphatase: 88 IU/L (ref 48–121)
BUN/Creatinine Ratio: 19 (ref 9–20)
BUN: 19 mg/dL (ref 6–20)
Bilirubin Total: 0.7 mg/dL (ref 0.0–1.2)
CO2: 23 mmol/L (ref 20–29)
Calcium: 9.9 mg/dL (ref 8.7–10.2)
Chloride: 104 mmol/L (ref 96–106)
Creatinine, Ser: 1 mg/dL (ref 0.76–1.27)
GFR calc Af Amer: 111 mL/min/{1.73_m2} (ref >59)
GFR calc non Af Amer: 96 mL/min/{1.73_m2} (ref >59)
Globulin, Total: 2.4 g/dL (ref 1.5–4.5)
Glucose: 86 mg/dL (ref 65–99)
Potassium: 4 mmol/L (ref 3.5–5.2)
Sodium: 140 mmol/L (ref 134–144)
Total Protein: 7.1 g/dL (ref 6.0–8.5)

## 2019-08-02 LAB — TRYPTASE: Tryptase: 5.6 ug/L (ref 2.2–13.2)

## 2019-08-02 LAB — ANA W/REFLEX IF POSITIVE: Anti Nuclear Antibody (ANA): NEGATIVE

## 2019-08-02 LAB — SEDIMENTATION RATE: Sed Rate: 3 mm/hr (ref 0–15)

## 2019-08-02 LAB — C-REACTIVE PROTEIN: CRP: <1 mg/L (ref 0–10)

## 2019-08-08 ENCOUNTER — Ambulatory Visit: Payer: Self-pay

## 2019-08-08 ENCOUNTER — Telehealth: Payer: Self-pay

## 2019-08-08 NOTE — Telephone Encounter (Signed)
Patient came in today to get his 2nd Dupixent injection however his medication was not in office. I informed him to contact his pharmacy and see what is going on. I called back to see about rescheduling his appointment once his medication came in and he informed me of this rash he has that comes and goes. He was seen in office in regards to this matter and wanted me to inform Dr. Dellis Anes that it is still going on. He has finished the prednisone which help but it has come back again. Patient is wondering what he could possibly do to help with the rash. Please advise.

## 2019-08-09 NOTE — Telephone Encounter (Signed)
Please make sure that he is using his antihistamines:   - Continue with cetirizine 10mg  twice daily. - Continue with famotidine 20 mg twice daily.  - Continue with hydroxyzine 25mg  at night.   We can change cetirizine to Allegra 180mg  tablet three times daily if the cetirizine is not helping. He could also try increasing the cetirizine to two tablets twice daily.  Once we get this Dupixent in a few times, I am hoping that will help control his itching/rash.   , MD [facility]

## 2019-08-09 NOTE — Telephone Encounter (Signed)
Called patient and informed him of Dr. Ellouise Newer recommendation. He did stated that he is taking Zyrtec 2 tablets 3 times a day. I informed him to switch to allegra to see if that helps. Patient verbalized understanding.

## 2019-08-16 ENCOUNTER — Other Ambulatory Visit: Payer: Self-pay

## 2019-08-16 MED ORDER — SYMBICORT 160-4.5 MCG/ACT IN AERO: 2.0000 | INHALATION_SPRAY | Freq: Two times a day (BID) | RESPIRATORY_TRACT | 1 refills | Status: AC

## 2019-08-16 MED ORDER — HYDROXYZINE HCL 25 MG PO TABS: 25.0000 mg | ORAL_TABLET | Freq: Three times a day (TID) | ORAL | 1 refills | Status: DC | PRN

## 2019-08-16 MED ORDER — MONTELUKAST SODIUM 10 MG PO TABS: 10.0000 mg | ORAL_TABLET | Freq: Every morning | ORAL | 1 refills | Status: DC

## 2019-08-16 MED ORDER — ALBUTEROL SULFATE HFA 108 (90 BASE) MCG/ACT IN AERS: INHALATION_SPRAY | RESPIRATORY_TRACT | 1 refills | Status: AC

## 2019-08-16 MED ORDER — FAMOTIDINE 20 MG PO TABS: 20.0000 mg | ORAL_TABLET | Freq: Two times a day (BID) | ORAL | 1 refills | Status: AC

## 2019-08-16 NOTE — Telephone Encounter (Signed)
The [facility] called stating they will need the patients prescriptions switched over to their pharmacy now.  Fax: [phone] Phone: [phone] Ext [phone]

## 2019-08-16 NOTE — Telephone Encounter (Signed)
Prescriptions have been sent over to the requested pharmacy.

## 2019-08-24 ENCOUNTER — Telehealth: Payer: Self-pay

## 2019-08-24 MED ORDER — DUPIXENT 300 MG/2ML ~~LOC~~ SOSY: 300.0000 mg | PREFILLED_SYRINGE | Freq: Once | SUBCUTANEOUS | 0 refills | Status: DC

## 2019-08-24 MED ORDER — DUPIXENT 300 MG/2ML ~~LOC~~ SOAJ: 30.0000 mg | SUBCUTANEOUS | 11 refills | Status: DC

## 2019-08-24 NOTE — Telephone Encounter (Signed)
Had been trying to get medication from [facility] per her primary Ins and was not aware he received medications from [facility] so I will fax note and send Rx to them.

## 2019-08-24 NOTE — Addendum Note (Signed)
Addended by: Devoria Glassing on: 08/24/2019 02:57 PM   Modules accepted: Orders

## 2019-08-24 NOTE — Telephone Encounter (Signed)
Dyanne Iha with [facility] called about patient's Dupixent. She states a prescription was supposed to be sent to the [facility] (f) [phone]. She stated office notes , documentation of medication tried and failed, and diagnosis needs to be sent to them along with a prescription for the Dupixent.   Call back number [phone] ext [phone]

## 2019-08-28 NOTE — Telephone Encounter (Signed)
Talked to [place] and she found ERX I sent and will start work on Rx [phone]

## 2019-08-28 NOTE — Telephone Encounter (Signed)
Sorry about the confusion.  Thank you for taking care of him.  Malachi Bonds, MD [facility]

## 2019-09-05 ENCOUNTER — Other Ambulatory Visit: Payer: Self-pay

## 2019-09-05 ENCOUNTER — Ambulatory Visit: Payer: No Typology Code available for payment source | Admitting: Allergy & Immunology

## 2019-09-05 ENCOUNTER — Ambulatory Visit (INDEPENDENT_AMBULATORY_CARE_PROVIDER_SITE_OTHER): Payer: No Typology Code available for payment source

## 2019-09-05 DIAGNOSIS — J339 Nasal polyp, unspecified: Secondary | ICD-10-CM | POA: Diagnosis not present

## 2019-09-05 DIAGNOSIS — L509 Urticaria, unspecified: Secondary | ICD-10-CM

## 2019-09-05 MED ORDER — DUPILUMAB 300 MG/2ML ~~LOC~~ SOSY
300.0000 mg | PREFILLED_SYRINGE | Freq: Once | SUBCUTANEOUS | Status: AC
  Administered 2019-09-05: 300 mg via SUBCUTANEOUS

## 2019-09-05 NOTE — Progress Notes (Signed)
Immunotherapy   Patient Details  Name: Troy Lyons MRN: [id number] Date of Birth: [birth year]  09/05/2019  Troy Lyons started injections for  Dupixent 300 mg given into the left upper abdomen by patient. Patient was instructed on how to give injection with auto-injector trainer. Patient did not wait his 30 mins stating he was extremely busy and need to leave. Advise patient the risk he is taking and he verbalized understanding.  Frequency: every 14 days Epi-Pen:Epi-Pen Available  Consent signed and patient instructions given.   [name] [name] 09/05/2019, 11:17 AM

## 2019-09-09 ENCOUNTER — Other Ambulatory Visit: Payer: Self-pay | Admitting: Allergy

## 2019-09-19 ENCOUNTER — Ambulatory Visit: Payer: PRIVATE HEALTH INSURANCE | Admitting: Allergy & Immunology

## 2019-09-19 ENCOUNTER — Ambulatory Visit: Payer: No Typology Code available for payment source | Admitting: Allergy & Immunology

## 2019-12-26 ENCOUNTER — Telehealth: Payer: Self-pay

## 2019-12-26 MED ORDER — MONTELUKAST SODIUM 10 MG PO TABS
10.0000 mg | ORAL_TABLET | Freq: Every morning | ORAL | 0 refills | Status: DC
Start: 2019-12-26 — End: 2020-04-08

## 2019-12-26 NOTE — Telephone Encounter (Signed)
Patients social worker Deniece Ree call from dept. [facility] affairs was contacting our office in requesting a refill on patients Singulair prescription. Patient was last seen in June 2021 and missed his appointment in August and has had several cancellation. Patient is needing a follow up visit for further refills.

## 2020-01-18 ENCOUNTER — Ambulatory Visit: Payer: No Typology Code available for payment source | Admitting: Family

## 2020-01-18 ENCOUNTER — Ambulatory Visit (INDEPENDENT_AMBULATORY_CARE_PROVIDER_SITE_OTHER): Payer: No Typology Code available for payment source | Admitting: Allergy & Immunology

## 2020-01-18 ENCOUNTER — Other Ambulatory Visit: Payer: Self-pay

## 2020-01-18 ENCOUNTER — Encounter: Payer: Self-pay | Admitting: Allergy & Immunology

## 2020-01-18 VITALS — HR 78 | Resp 17

## 2020-01-18 DIAGNOSIS — L239 Allergic contact dermatitis, unspecified cause: Secondary | ICD-10-CM | POA: Diagnosis not present

## 2020-01-18 DIAGNOSIS — J329 Chronic sinusitis, unspecified: Secondary | ICD-10-CM

## 2020-01-18 DIAGNOSIS — J4540 Moderate persistent asthma, uncomplicated: Secondary | ICD-10-CM

## 2020-01-18 DIAGNOSIS — J302 Other seasonal allergic rhinitis: Secondary | ICD-10-CM

## 2020-01-18 DIAGNOSIS — Z880 Allergy status to penicillin: Secondary | ICD-10-CM

## 2020-01-18 DIAGNOSIS — J3089 Other allergic rhinitis: Secondary | ICD-10-CM

## 2020-01-18 NOTE — Patient Instructions (Addendum)
1. Moderate persistent asthma, uncomplicated - Lung testing looks deferred today. - We are not going to make any changes at this point in time.  - Daily controller medication(s): Symbicort 160/4.13mcg two puffs twice daily with spacer + Dupixent every two weeks - Prior to physical activity: albuterol 2 puffs 10-15 minutes before physical activity. - Rescue medications: albuterol 4 puffs every 4-6 hours as needed  - Asthma control goals:  * Full participation in all desired activities (may need albuterol before activity) * Albuterol use two time or less a week on average (not counting use with activity) * Cough interfering with sleep two time or less a month * Oral steroids no more than once a year * No hospitalizations  2. Seasonal and perennial allergic rhinitis (dust mites, dog, grass, mold, trees, weeds, and ragweed)  - Continue with Xhance two sprays per nostril twice daily as needed.  - Continue with cetirizine 10mg  twice daily.  3. Penicillin allergy - Consider penicillin testing and challenge in the future.  - 90% of penicillin allergic patients lose their sensitization over ten years.  4. Pruritic rash - I think a job change is the best thing for this.  - I would be happy to write a letter. - I will write that today and send it to your house.   5. Return in about 6 months (around 07/18/2020).    Please inform 09/17/2020 of any Emergency Department visits, hospitalizations, or changes in symptoms. Call us before going to the ED for breathing or allergy symptoms since we might be able to fit you in for a sick visit. Feel free to contact us anytime with any questions, problems, or concerns.  It was a pleasure to see you again today!  Websites that have reliable patient information: 1. American Academy of Asthma, Allergy, and Immunology: www.aaaai.org 2. Food Allergy Research and Education (FARE): foodallergy.org 3. Mothers of Asthmatics: http://www.asthmacommunitynetwork.org 4.  American College of Allergy, Asthma, and Immunology: www.acaai.org   COVID-19 Vaccine Information can be found at: [place] For questions related to vaccine distribution or appointments, please email [email or web address] or call [phone].     Like [phone] on [place] and Instagram for our latest updates!     HAPPY FALL!     Make sure you are registered to vote! If you have moved or changed any of your contact information, you will need to get this updated before voting!  In some cases, you MAY be able to register to vote online: [employer]

## 2020-01-18 NOTE — Progress Notes (Signed)
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 Moderate persistent asthma, uncomplicated - Lung testing looks deferred today. - We are not going to make any changes at this point in time.  - Daily controller medication(s): Symbicort 160/4.25mcg two puffs twice daily with spacer + Dupixent every two weeks - Prior to physical activity: albuterol 2 puffs 10-15 minutes before physical activity. - Rescue medications: albuterol 4 puffs every 4-6 hours as needed  - Asthma control goals:  * Full participation in all desired activities (may need albuterol before activity) * Albuterol use two time or less a week on average (not counting use with activity) * Cough interfering with sleep two time or less a month * Oral steroids no more than once a year * No hospitalizations  2. Seasonal and perennial allergic rhinitis (dust mites, dog, grass, mold, trees, weeds, and ragweed)  - Continue with Xhance two sprays per nostril twice daily as needed.  - Continue with cetirizine 10mg  twice daily.  3. Penicillin allergy - Consider penicillin testing and challenge in the future.  - 90% of penicillin allergic patients lose their sensitization over ten years.  4. Pruritic rash - I think a job change is the best thing for this.  - I would be happy to write a letter. - I will write that today and send it to your house.   5. Return in about 6 months (around 07/18/2020).    Subjective:   Troy Lyons is a 39 y.o. male presenting today for follow up of  Chief Complaint    Patient presents with  . Asthma  . Allergic Rhinitis     Troy Lyons has a history of the following: Patient Active Problem List   Diagnosis Date Noted  . Seasonal and perennial allergic rhinitis 07/18/2019  . Moderate persistent asthma without complication 07/18/2019  . Frequent sinus infections 05/16/2019  . Nasal polyp 05/16/2019  . Pruritic rash 05/16/2019  . Penicillin allergy 05/16/2019  . Asthma 05/16/2019  . Seasonal and perennial allergic rhinoconjunctivitis 05/16/2019  . Acute respiratory failure with hypoxia (HCC) 01/03/2019  . Pneumonia due to COVID-19 virus 01/01/2019  . MS (multiple sclerosis) (HCC) 01/01/2019    History obtained from: chart review and patient.  Troy Lyons is a 39 y.o. male presenting for a follow up visit.  He was last seen in June 2021 as a new patient.  At that time, his lung testing looked low.  We started him on Dupixent due to a history of eosinophilia and uncontrolled asthma.  He had previous testing via the blood that was positive to dust mites, dog, grass, mold, trees, weeds, and ragweed.  We continued him on XHANCE 2 sprays per nostril twice daily.  We also continue with cetirizine 10 mg daily.  He had a history of recurrent infections, but we decided to try to address his atopic disease first before doing an immune work-up.  He has a history of penicillin allergy and we recommended testing and challenge.  He also endorsed a rash for which we started multiple antihistamines and obtain lab work.  Lab work was largely normal aside  from elevated ALT, which seem to be his baseline.  In the interim, he has started to pick sent and is given to him self at home.  He does report today that he has had some outbreaks under a bulletproof vest.  Evidently, this is required for him to wear during all work hours.  He has tried dealing with it by wearing an undershirt as well as another barrier type device to keep it from contacting his skin.  Despite this, as  well as the use of multiple topical emollients, he continues to have a rash in the shape of the vest on his chest after wearing it.  It does go away over the weekend, but is very itchy and painful.  He has talked to his supervisors and they have not been very flexible regarding the use of using different vests/undergarments.  He is willing to purchase his own vest, but they will not allow him to do that.  He is actually looking for another job.  Evidently, his PCP has written multiple notes as well.  He is wondering if I can write a letter myself.  Asthma/Respiratory Symptom History: He rmeains on the Symbicort twice daily. He is not using the rescue inhaler much at night.  He feels like his symptoms are under good control.  He has not needed prednisone and has not been to the emergency room.  He is sleeping well at night.  Allergic Rhinitis Symptom History: He is using the [facility] as well as the cetirizine 10mg  daily. This is the best his sinuses have been in years. He has not needed antibiotics in several months at this point, right before he started the Dupixent.  Overall, his sinuses have never been better.  Eczema Symptom History: The Dupixent has helped trmenmendously. He is not having issues with the shot. He is geting it in the stomach. He is not having issues with delivery. Overall, the rash tends been well controlled with the Dupixent.  He has not had any issues with delivery of the medication thus far.  His penicillin reaction was in 2008.  He is not very interested in retesting, but is open to hearing more about it.  His multiple sclerosis is stable on Tysabri.  Address the letter to [facility].   Otherwise, there have been no changes to his past medical history, surgical history, family history, or social history.    Review of Systems  Constitutional: Negative.  Negative for chills, fever, malaise/fatigue and weight loss.  HENT: Positive for congestion. Negative for ear  discharge, ear pain and sinus pain.   Eyes: Negative for pain, discharge and redness.  Respiratory: Negative for cough, sputum production, shortness of breath and wheezing.   Cardiovascular: Negative.  Negative for chest pain and palpitations.  Gastrointestinal: Negative for abdominal pain, constipation, diarrhea, heartburn, nausea and vomiting.  Skin: Positive for itching and rash.  Neurological: Negative for dizziness and headaches.  Endo/Heme/Allergies: Positive for environmental allergies. Does not bruise/bleed easily.       Objective:   Pulse 78, resp. rate 17, SpO2 98 %. There is no height or weight on file to calculate BMI.   Physical Exam:  Physical Exam Constitutional:      Appearance: He is well-developed.     Comments: Well built.   HENT:     Head: Normocephalic and atraumatic.     Right Ear: Tympanic membrane, ear canal and external ear normal.     Left Ear: Tympanic membrane, ear canal and  external ear normal.     Nose: No nasal deformity, septal deviation, mucosal edema or rhinorrhea.     Right Turbinates: Enlarged and swollen.     Left Turbinates: Enlarged and swollen.     Right Sinus: No maxillary sinus tenderness or frontal sinus tenderness.     Left Sinus: No maxillary sinus tenderness or frontal sinus tenderness.     Mouth/Throat:     Mouth: Mucous membranes are not pale and not dry.     Pharynx: Uvula midline.  Eyes:     General:        Right eye: No discharge.        Left eye: No discharge.     Conjunctiva/sclera: Conjunctivae normal.     Right eye: Right conjunctiva is not injected. No chemosis.    Left eye: Left conjunctiva is not injected. No chemosis.    Pupils: Pupils are equal, round, and reactive to light.  Cardiovascular:     Rate and Rhythm: Normal rate and regular rhythm.     Heart sounds: Normal heart sounds.  Pulmonary:     Effort: Pulmonary effort is normal. No tachypnea, accessory muscle usage or respiratory distress.     Breath  sounds: Normal breath sounds. No wheezing, rhonchi or rales.  Chest:     Chest wall: No tenderness.  Lymphadenopathy:     Cervical: No cervical adenopathy.  Skin:    Coloration: Skin is not pale.     Findings: No abrasion, erythema, petechiae or rash. Rash is not papular, urticarial or vesicular.     Comments: Erythematous rash on the upper chest. No vesicle formation. No urticaria.   Neurological:     Mental Status: He is alert.  Psychiatric:        Behavior: Behavior is cooperative.      Diagnostic studies: none     Malachi Bonds, MD  [facility]

## 2020-01-23 ENCOUNTER — Telehealth: Payer: Self-pay

## 2020-01-23 ENCOUNTER — Other Ambulatory Visit: Payer: Self-pay | Admitting: *Deleted

## 2020-01-23 NOTE — Telephone Encounter (Signed)
Patient's pharmacy called and said that the Symbicort 160/4.5 mcg was no longer covered and asked that Wixela be sent in instead. Please advise.

## 2020-01-23 NOTE — Telephone Encounter (Signed)
Unfortunately it does not show coverage when trying to send in the medication, would you like for [place] to send it in and see if it is covered?

## 2020-01-23 NOTE — Telephone Encounter (Signed)
I would rather have Advair HFA.  Is the 115/21 mcg dose however?  That would be 2 puffs twice daily.  Malachi Bonds, MD [facility]

## 2020-01-24 ENCOUNTER — Other Ambulatory Visit: Payer: Self-pay | Admitting: *Deleted

## 2020-01-24 MED ORDER — ADVAIR HFA 115-21 MCG/ACT IN AERO: 2.0000 | INHALATION_SPRAY | Freq: Two times a day (BID) | RESPIRATORY_TRACT | 5 refills | Status: DC

## 2020-01-24 NOTE — Telephone Encounter (Signed)
New prescription has been sent in. Will wait and see if there is a reply from the pharmacy.

## 2020-01-24 NOTE — Telephone Encounter (Signed)
Let's try it and think optimistic happy thoughts!   Troy Bonds, MD [facility]

## 2020-04-08 ENCOUNTER — Telehealth: Payer: Self-pay | Admitting: Allergy & Immunology

## 2020-04-08 MED ORDER — MONTELUKAST SODIUM 10 MG PO TABS: 10.0000 mg | ORAL_TABLET | Freq: Every morning | ORAL | 3 refills | Status: AC

## 2020-04-08 NOTE — Telephone Encounter (Signed)
Refills sent. Last refill was in November 2021. Patient had a recent visit back in December of 2021.

## 2020-04-08 NOTE — Telephone Encounter (Signed)
[employer] called, spoke with Dyanne Iha, stating patient is requesting a refill on montelukast. Yvonna Alanis states they sometimes have problems with receiving the fax, but her fax number is [phone] in case the fax does not go through to the [facility]. Kaitlyn's direct number is [phone].  Please advise.

## 2020-07-24 ENCOUNTER — Telehealth: Payer: Self-pay | Admitting: Allergy & Immunology

## 2020-07-24 NOTE — Telephone Encounter (Signed)
New request for [facility] authorization faxed to [facility], Fax # [phone].

## 2020-07-30 ENCOUNTER — Ambulatory Visit (INDEPENDENT_AMBULATORY_CARE_PROVIDER_SITE_OTHER): Payer: No Typology Code available for payment source | Admitting: Allergy & Immunology

## 2020-07-30 ENCOUNTER — Encounter: Payer: Self-pay | Admitting: Allergy & Immunology

## 2020-07-30 ENCOUNTER — Other Ambulatory Visit: Payer: Self-pay

## 2020-07-30 VITALS — BP 118/78 | HR 67 | Temp 98.7°F | Resp 18 | Ht 75.0 in | Wt 277.0 lb

## 2020-07-30 DIAGNOSIS — L239 Allergic contact dermatitis, unspecified cause: Secondary | ICD-10-CM

## 2020-07-30 DIAGNOSIS — J302 Other seasonal allergic rhinitis: Secondary | ICD-10-CM

## 2020-07-30 DIAGNOSIS — J4540 Moderate persistent asthma, uncomplicated: Secondary | ICD-10-CM | POA: Diagnosis not present

## 2020-07-30 DIAGNOSIS — J3089 Other allergic rhinitis: Secondary | ICD-10-CM

## 2020-07-30 DIAGNOSIS — Z880 Allergy status to penicillin: Secondary | ICD-10-CM | POA: Diagnosis not present

## 2020-07-30 DIAGNOSIS — L282 Other prurigo: Secondary | ICD-10-CM

## 2020-07-30 DIAGNOSIS — L509 Urticaria, unspecified: Secondary | ICD-10-CM

## 2020-07-30 NOTE — Progress Notes (Signed)
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 The Dupixent has done very well with regards to controlling his asthma as well as his rhinitis and skin.  We are can to continue with this for now.  He is using his Symbicort 2 puffs twice daily.  We could certainly back down on that, but he prefers to stay where he is.  I still think he could benefit from penicillin testing and a challenge.  We will discuss that more at future visits.     Plan/Recommendations:   1. Moderate persistent asthma, uncomplicated - Lung testing looks awesome today. - We are not going to make any medication changes at all.   - Daily controller medication(s): Symbicort 160/4.40mcg two puffs twice daily with spacer + Dupixent every two weeks - Prior to physical activity: albuterol 2 puffs 10-15 minutes before physical activity. - Rescue medications: albuterol 4 puffs every 4-6 hours as needed  - Asthma control goals:  * Full participation in all desired activities (may need albuterol before activity) * Albuterol use two time or less a week on average (not counting use with activity) * Cough interfering with sleep two time or less a month * Oral steroids no more than once a year * No hospitalizations  2. Seasonal and perennial allergic rhinitis (dust mites, dog, grass, mold, trees, weeds, and ragweed)  - Continue with Xhance two sprays per nostril twice daily as needed.  - Continue with cetirizine 10mg  twice daily.  3. Penicillin allergy - Consider penicillin testing and  challenge in the future.  - 90% of penicillin allergic patients lose their sensitization over ten years.  4. Return in about 6 months (around 01/29/2021).  Subjective:   Troy Lyons is a 40 y.o. male presenting today for follow up of  Chief Complaint  Patient presents with   Asthma    [name] has a history of the following: Patient Active Problem List   Diagnosis Date Noted   Seasonal and perennial allergic rhinitis 07/18/2019   Moderate persistent asthma without complication 07/18/2019   Frequent sinus infections 05/16/2019   Nasal polyp 05/16/2019   Pruritic rash 05/16/2019   Penicillin allergy 05/16/2019   Asthma 05/16/2019   Seasonal and perennial allergic rhinoconjunctivitis 05/16/2019   Acute respiratory failure with hypoxia (HCC) 01/03/2019   Pneumonia due to COVID-19 virus 01/01/2019   MS (multiple sclerosis) (HCC) 01/01/2019    History obtained from: chart review and patient.  Troy Lyons is a 40 y.o. male presenting for a follow up visit.  We last saw him in December 2021.  At that time, his lung testing was not done.  We continue with Symbicort 160 mcg 2 puffs twice daily as well as Dupixent every 2 weeks.  For his allergic rhinitis, we continued with Xhance as well as cetirizine.  We did talk about doing a penicillin testing and challenge in the future.  For his rash, we recommended a job change.  Asthma/Respiratory Symptom History: He remains on the Symbicort 2 puffs twice daily.  He has not needed his  rescue inhaler.  This in combination with the Dupixent has been controlling his symptoms very well.  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 He has not been in the ED and has not needed systemic steroids.  Allergic Rhinitis Symptom History: He remains on the [place] on a PRN basis. Dupixnet has helped to decrease the need for it. He uses the Netti pot routinely.  Overall, symptoms are very well controlled.  Eczema Symptom History: He  developed a new rash on right leg. He had testing for RMSF. He never had a a fever. He was placed on doxycycline and then also on a prednisone taper.  However, the remainder of his rashes have all resolved.  He was able to change to a different material for his bulletproof vest at work.  This is helped to decrease his rash over his body.  Otherwise, there have been no changes to his past medical history, surgical history, family history, or social history.    Review of Systems  Constitutional: Negative.  Negative for chills, fever, malaise/fatigue and weight loss.  HENT:  Positive for congestion. Negative for ear discharge, ear pain and sinus pain.   Eyes:  Negative for pain, discharge and redness.  Respiratory:  Negative for cough, sputum production, shortness of breath and wheezing.   Cardiovascular: Negative.  Negative for chest pain and palpitations.  Gastrointestinal:  Negative for abdominal pain, constipation, diarrhea, heartburn, nausea and vomiting.  Skin:  Positive for itching and rash.  Neurological:  Negative for dizziness and headaches.  Endo/Heme/Allergies:  Positive for environmental allergies. Does not bruise/bleed easily.      Objective:   Blood pressure 118/78, pulse 67, temperature 98.7 F (37.1 C), temperature source Temporal, resp. rate 18, height 6\' 3"  (1.905 m), weight 277 lb (125.6 kg), SpO2 96 %. Body mass index is 34.62 kg/m.   Physical Exam:  Physical Exam Constitutional:      Appearance: He is well-developed.     Comments: Well mannered.  Friendly.  HENT:     Head: Normocephalic and atraumatic.     Right Ear: Tympanic membrane, ear canal and external ear normal.     Left Ear: Tympanic membrane, ear canal and external ear normal.     Nose: No nasal deformity, septal deviation, mucosal edema or rhinorrhea.     Right Turbinates: Enlarged and swollen.     Left Turbinates: Enlarged and swollen.     Right Sinus: No maxillary sinus tenderness or frontal  sinus tenderness.     Left Sinus: No maxillary sinus tenderness or frontal sinus tenderness.     Comments: No nasal polyps.    Mouth/Throat:     Mouth: Mucous membranes are not pale and not dry.     Pharynx: Uvula midline.  Eyes:     General: Lids are normal. Allergic shiner present.        Right eye: No discharge.        Left eye: No discharge.     Conjunctiva/sclera: Conjunctivae normal.     Right eye: Right conjunctiva is not injected. No chemosis.    Left eye: Left conjunctiva is not injected. No chemosis.    Pupils: Pupils are equal, round, and reactive to light.  Cardiovascular:     Rate and Rhythm: Normal rate and regular rhythm.     Heart sounds: Normal heart sounds.  Pulmonary:     Effort: Pulmonary effort is normal. No tachypnea, accessory muscle usage or respiratory distress.  Breath sounds: Normal breath sounds. No wheezing, rhonchi or rales.     Comments: Moving air well in all lung fields.  No increased work of breathing. Chest:     Chest wall: No tenderness.  Lymphadenopathy:     Cervical: No cervical adenopathy.  Skin:    General: Skin is warm.     Capillary Refill: Capillary refill takes less than 2 seconds.     Coloration: Skin is not pale.     Findings: Rash present. No abrasion, erythema or petechiae. Rash is not papular, urticarial or vesicular.     Comments: He does have some erythematous patches on his bilateral lower extremities.  This is evidently better than it was in the past.  He is only on day 3 of prednisone.  Neurological:     Mental Status: He is alert.  Psychiatric:        Behavior: Behavior is cooperative.     Diagnostic studies:    Spirometry: results normal (FEV1: 3.80/81%, FVC: 4.85/83%, FEV1/FVC: 78%).    Spirometry consistent with normal pattern.    Allergy Studies: none        Malachi Bonds, MD  [facility]

## 2020-07-30 NOTE — Patient Instructions (Addendum)
1. Moderate persistent asthma, uncomplicated - Lung testing looks awesome today. - We are not going to make any medication changes at all.   - Daily controller medication(s): Symbicort 160/4.85mcg two puffs twice daily with spacer + Dupixent every two weeks - Prior to physical activity: albuterol 2 puffs 10-15 minutes before physical activity. - Rescue medications: albuterol 4 puffs every 4-6 hours as needed  - Asthma control goals:  * Full participation in all desired activities (may need albuterol before activity) * Albuterol use two time or less a week on average (not counting use with activity) * Cough interfering with sleep two time or less a month * Oral steroids no more than once a year * No hospitalizations  2. Seasonal and perennial allergic rhinitis (dust mites, dog, grass, mold, trees, weeds, and ragweed)  - Continue with Xhance two sprays per nostril twice daily as needed.  - Continue with cetirizine 10mg  twice daily.  3. Penicillin allergy - Consider penicillin testing and challenge in the future.  - 90% of penicillin allergic patients lose their sensitization over ten years.  4. Pruritic rash - I think a job change is the best thing for this.  - I would be happy to write a letter. - I will write that today and send it to your house.   5. No follow-ups on file.    Please inform of any Emergency Department visits, hospitalizations, or changes in symptoms. Call us before going to the ED for breathing or allergy symptoms since we might be able to fit you in for a sick visit. Feel free to contact us anytime with any questions, problems, or concerns.  It was a pleasure to see you again today!  Websites that have reliable patient information: 1. American Academy of Asthma, Allergy, and Immunology: www.aaaai.org 2. Food Allergy Research and Education (FARE): foodallergy.org 3. Mothers of Asthmatics: http://www.asthmacommunitynetwork.org 4. American College of Allergy,  Asthma, and Immunology: www.acaai.org   COVID-19 Vaccine Information can be found at: [place] For questions related to vaccine distribution or appointments, please email [email or web address] or call [phone].   We realize that you might be concerned about having an allergic reaction to the COVID19 vaccines. To help with that concern, WE ARE OFFERING THE COVID19 VACCINES IN OUR OFFICE! Ask the front desk for dates!     "Like" [phone] on Facebook and Instagram for our latest updates!      A healthy democracy works best when [place] participate! Make sure you are registered to vote! If you have moved or changed any of your contact information, you will need to get this updated before voting!  In some cases, you MAY be able to register to vote online: [employer]

## 2020-07-31 NOTE — Telephone Encounter (Signed)
Spoke with Bonita Quin at [facility] and she is sending a new authorization for patient. Bonita Quin is requesting office notes from 6/15 be sent to [facility] at [phone].

## 2020-09-08 IMAGING — DX DG CHEST 1V PORT
2 series · 2 of 2 positions shown · non-contrast
Comparison: 12/30/2018

CLINICAL DATA: COVID, shortness of breath

EXAM:
PORTABLE CHEST 1 VIEW

[chest ap]
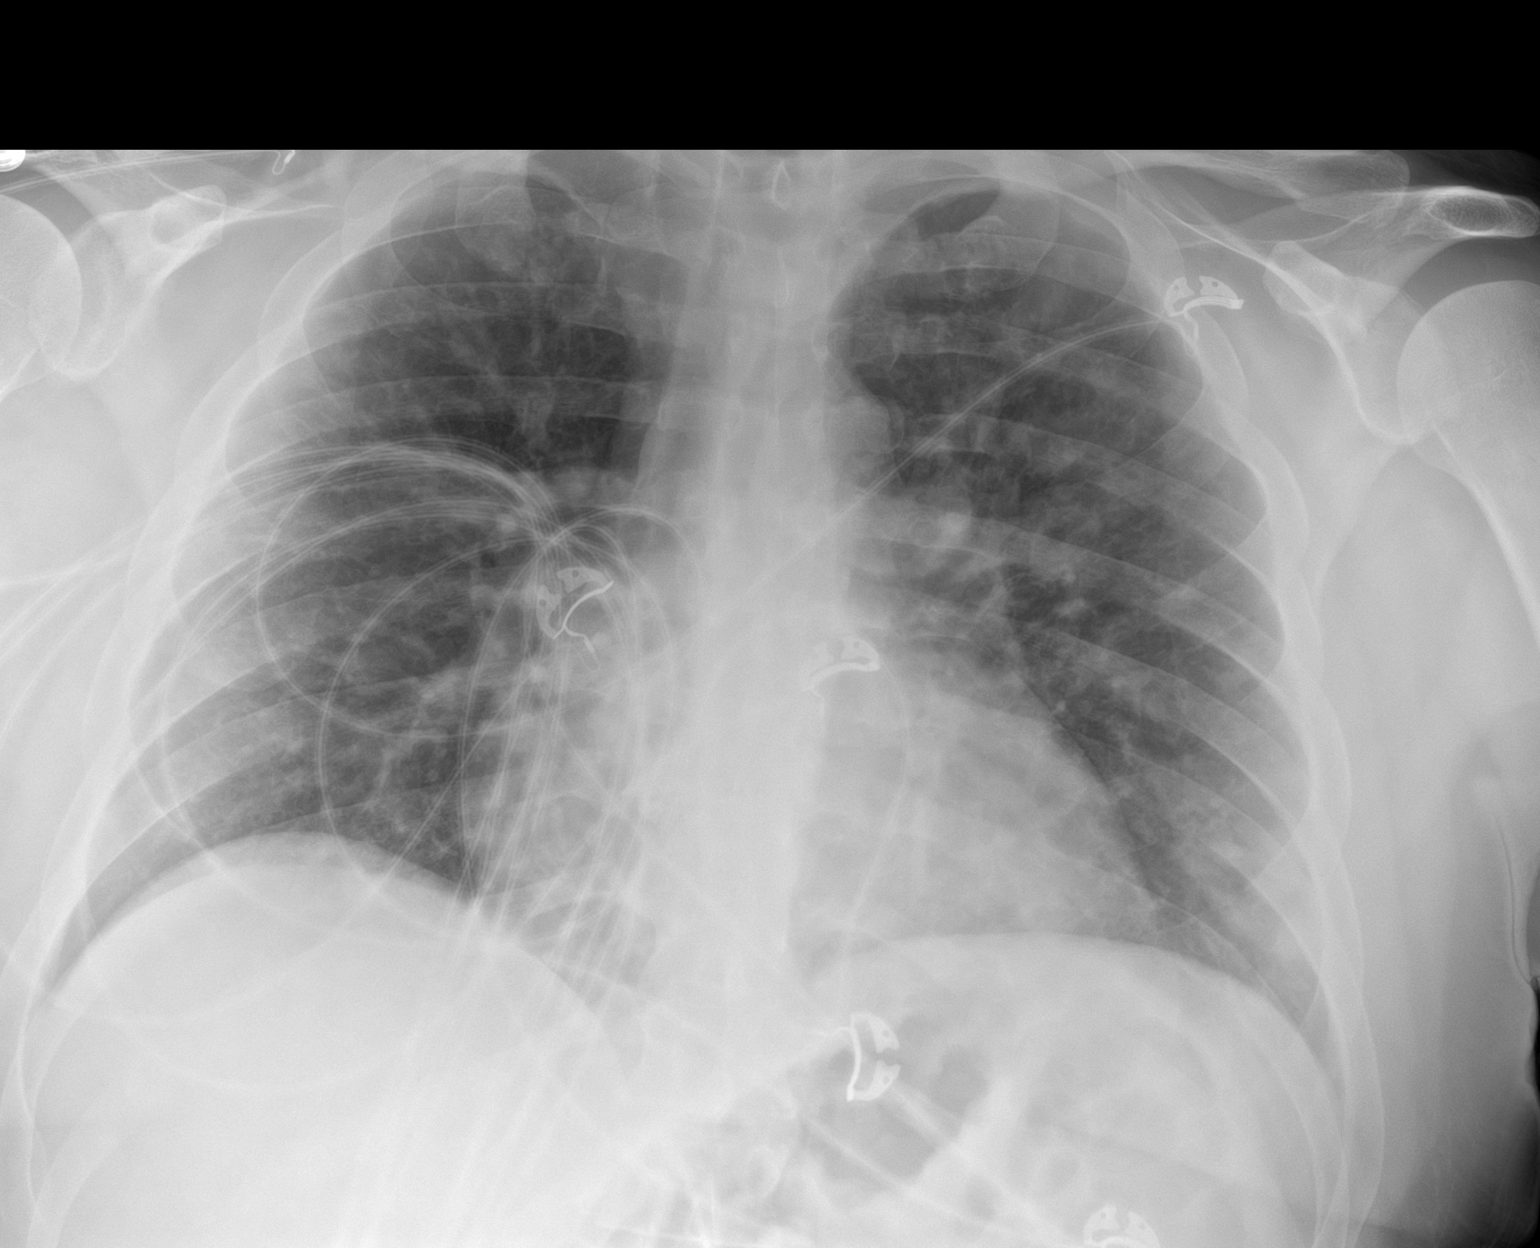

[chest ap grid]
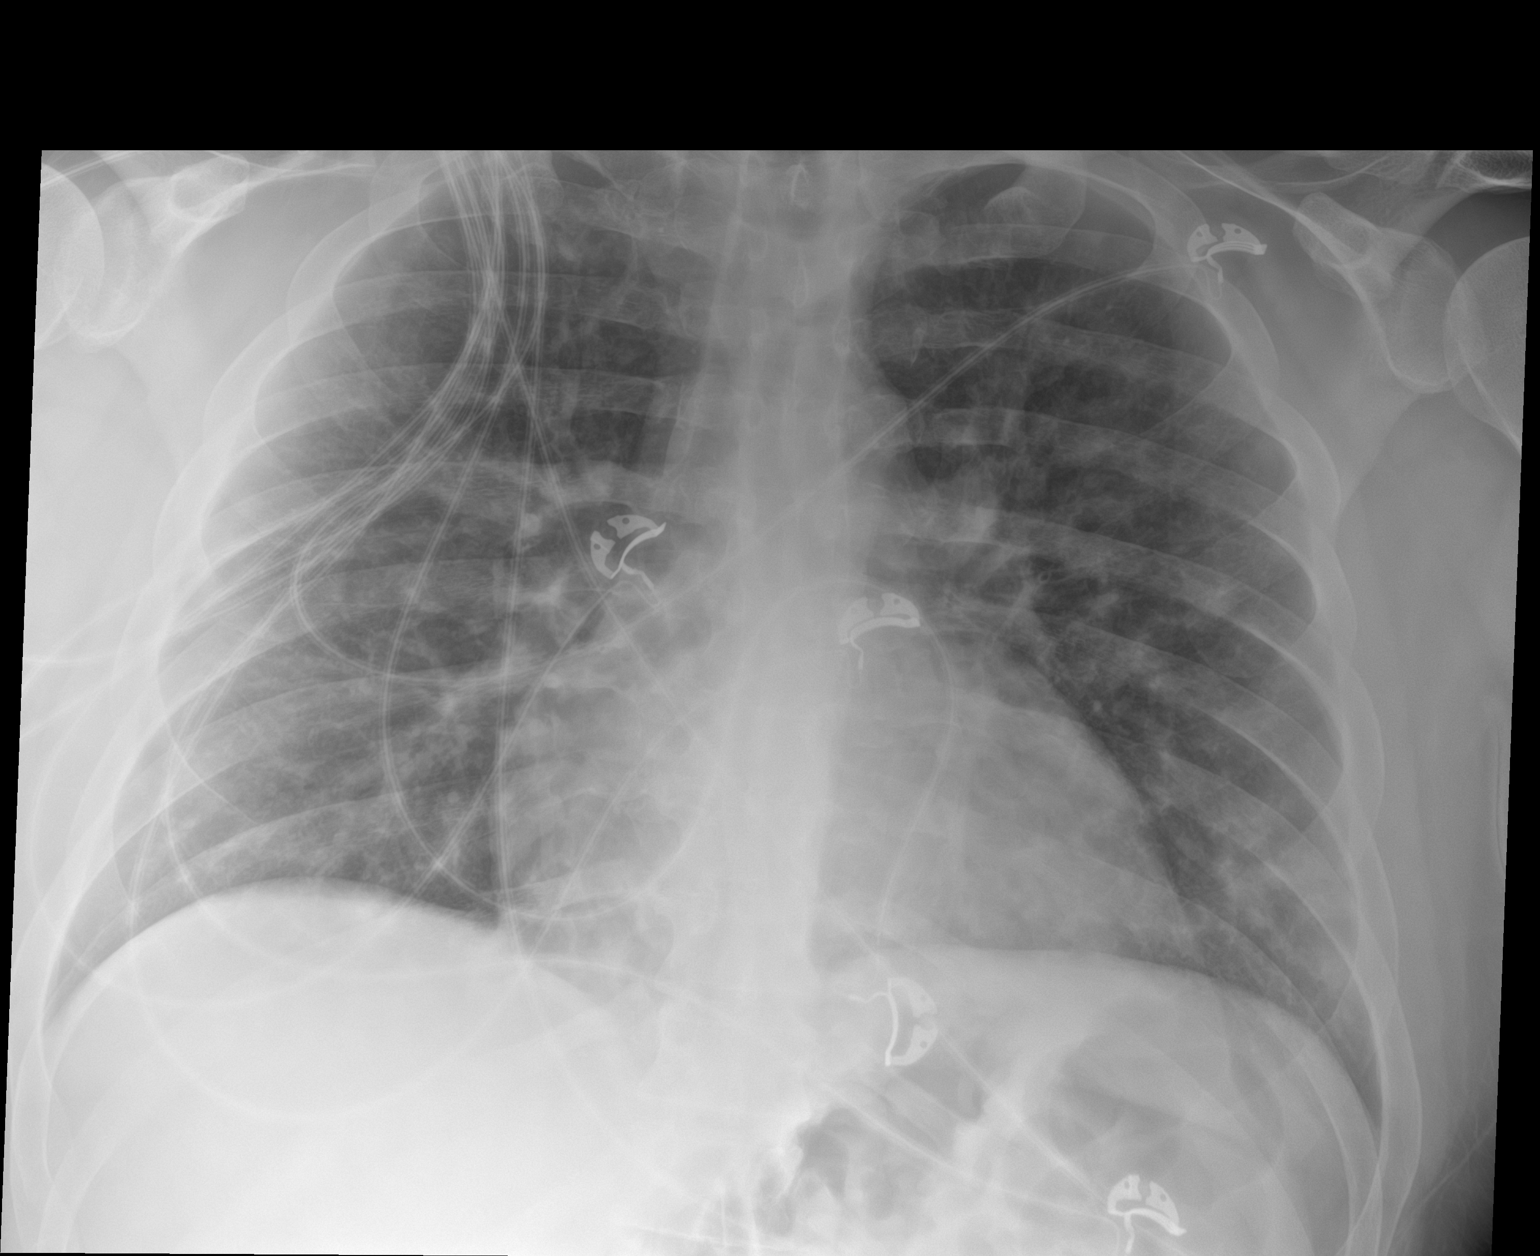

[2 of 2 positions shown; findings below may reference images not displayed]

FINDINGS: Heart is normal size. Vague patchy opacities in the lungs again
noted, unchanged. No effusions. No acute bony abnormality.
IMPRESSION: Vague patchy opacities within the lungs concerning for pneumonia.

## 2020-10-02 ENCOUNTER — Other Ambulatory Visit: Payer: Self-pay | Admitting: *Deleted

## 2020-10-02 MED ORDER — DUPIXENT 300 MG/2ML ~~LOC~~ SOAJ: 30.0000 mg | SUBCUTANEOUS | 11 refills | Status: DC

## 2021-04-14 NOTE — Patient Instructions (Incomplete)
1. Moderate persistent asthma, uncomplicated  ?- Daily controller medication(s): Symbicort 160/4.51mcg two puffs twice daily with spacer + Dupixent every two weeks ?- Prior to physical activity: albuterol 2 puffs 10-15 minutes before physical activity. ?- Rescue medications: albuterol 4 puffs every 4-6 hours as needed  ?- Asthma control goals:  ?* Full participation in all desired activities (may need albuterol before activity) ?* Albuterol use two time or less a week on average (not counting use with activity) ?* Cough interfering with sleep two time or less a month ?* Oral steroids no more than once a year ?* No hospitalizations ? ?2. Seasonal and perennial allergic rhinitis (dust mites, dog, grass, mold, trees, weeds, and ragweed)  ?- Continue with Xhance two sprays per nostril twice daily as needed.  ?- Continue with cetirizine 10mg  twice daily. ? ?3. Penicillin allergy ?- Consider penicillin testing and challenge in the future.  ?- 90% of penicillin allergic patients lose their sensitization over ten years. ? ?4. Pruritic rash ?.  ? ?5. Schedule a follow up appointment in months ? ?

## 2021-04-15 ENCOUNTER — Ambulatory Visit: Admitting: Family

## 2021-08-05 ENCOUNTER — Telehealth: Payer: Self-pay | Admitting: Family

## 2021-08-05 NOTE — Telephone Encounter (Signed)
Pt no showed last appointment, current authorization expires 08-11-2021. Pt has not scheduled another appointment. Closing referral. Pt will need another authorization to be seen.

## 2021-12-28 NOTE — Telephone Encounter (Signed)
FYI :  Patient/wife called to schedule a follow up. I let them both know his referral expired back in June and another one will be needed. [facility].

## 2022-01-11 NOTE — Telephone Encounter (Signed)
Patient's wife called requesting our office to resend the patients last note over to the [place]. Albin Felling can you work on this please?  Thanks

## 2022-01-12 NOTE — Telephone Encounter (Signed)
[facility] called again requesting last office  note for [facility]. Records have been sent to Delia Chimes [phone])

## 2022-01-13 NOTE — Telephone Encounter (Signed)
Received new authorization. It has been updated in system.

## 2022-01-20 ENCOUNTER — Encounter: Payer: Self-pay | Admitting: Family Medicine

## 2022-01-20 ENCOUNTER — Ambulatory Visit (INDEPENDENT_AMBULATORY_CARE_PROVIDER_SITE_OTHER): Payer: No Typology Code available for payment source | Admitting: Family Medicine

## 2022-01-20 ENCOUNTER — Telehealth: Payer: Self-pay

## 2022-01-20 VITALS — BP 122/74 | HR 93 | Temp 98.0°F | Resp 16 | Ht 74.5 in | Wt 278.4 lb

## 2022-01-20 DIAGNOSIS — J4540 Moderate persistent asthma, uncomplicated: Secondary | ICD-10-CM | POA: Diagnosis not present

## 2022-01-20 DIAGNOSIS — L501 Idiopathic urticaria: Secondary | ICD-10-CM | POA: Diagnosis not present

## 2022-01-20 DIAGNOSIS — Z91038 Other insect allergy status: Secondary | ICD-10-CM

## 2022-01-20 DIAGNOSIS — Z880 Allergy status to penicillin: Secondary | ICD-10-CM | POA: Diagnosis not present

## 2022-01-20 DIAGNOSIS — J3089 Other allergic rhinitis: Secondary | ICD-10-CM

## 2022-01-20 DIAGNOSIS — J302 Other seasonal allergic rhinitis: Secondary | ICD-10-CM

## 2022-01-20 MED ORDER — EPINEPHRINE 0.3 MG/0.3ML IJ SOAJ: 0.3000 mg | Freq: Once | INTRAMUSCULAR | 1 refills | Status: DC | PRN

## 2022-01-20 MED ORDER — XHANCE 93 MCG/ACT NA EXHU: 2.0000 | INHALANT_SUSPENSION | Freq: Two times a day (BID) | NASAL | 3 refills | Status: DC

## 2022-01-20 NOTE — Progress Notes (Signed)
[address] [place] [address] Dept: [phone]  FOLLOW UP NOTE  Patient ID: Troy Lyons, male    DOB: 19-Jan-1981  Age: 41 y.o. MRN: [id number] Date of Office Visit: 01/20/2022  Assessment  Chief Complaint: Urticaria (Hives have came back they come and go. No changes in anything and woke up one morning and had trouble breathing and broke out in hives. Went to the ER for low oxygen. Had 3 doses of epinephrine and steroids.) and Other (Feels some pressure around the head and has gotten worse. Is using benadryl about 12 capsules a day. )  HPI Troy Lyons is a 41 year old male who presents to the clinic for a follow up visit. He was last seen in this clinic on 07/30/2020 by Dr. Dellis Anes for evaluation of asthma, allergic rhinitis, recurrent infection, penicillin allergy, rash, and MS. at today's visit, he reports that he has been experiencing hives that began in the springtime and occur intermittently.  He does report occasional shortness of breath and wheeze with.  He visited the emergency department on 02/11/2022 for decreased oxygen, shortness of breath, and hives which occurred overnight for which he required epipen three times in addition to steroids.  He does report that he works outside a lot, has had several tick bites this year.  He denies new medications, new personal care products, new foods, or insect stings. Asthma is reported as moderately well controlled with symptoms including occasional wheeze and intermittent cough. He reports that he continues Symbicort 2 puffs twice a day and uses albuterol infrequently. Allergic rhinitis is reported as moderately well controlled with frequent nasal congestion for which he takes Benadryl and is using a nasal steroid spray occasionally. His last MRI cervical spine wo contrast on 02/12/2021 indicates "moderate mucosal thickening in the visualized maxillary and sphenoid sinuses". He reports that he has stepped in a yellow  jacket nest and had some hives. He continues to avoid penicillin. His current medications are listed in the chart.    Drug Allergies:  Allergies  Allergen Reactions   Penicillins Anaphylaxis    Has patient had a PCN reaction causing immediate rash, facial/tongue/throat swelling, SOB or lightheadedness with hypotension: Yes Has patient had a PCN reaction causing severe rash involving mucus membranes or skin necrosis: No Has patient had a PCN reaction that required hospitalization No Has patient had a PCN reaction occurring within the last 10 years: No If all of the above answers are "NO", then may proceed with Cephalosporin use.    Poison Sumac Extract Anaphylaxis    Severe blistering and breathing problems   Carbamazepine Itching   Fingolimod Hives    rash rash     Physical Exam: BP 122/74   Pulse 93   Temp 98 F (36.7 C)   Resp 16   Ht 6' 2.5" (1.892 m)   Wt 278 lb 6 oz (126.3 kg)   SpO2 97%   BMI 35.26 kg/m    Physical Exam Vitals reviewed.  Constitutional:      Appearance: Normal appearance.  HENT:     Head: Normocephalic and atraumatic.     Right Ear: Tympanic membrane normal.     Left Ear: Tympanic membrane normal.     Nose:     Comments: Bilateral nares slightly erythematous with clear nasal drainage noted. Pharynx slightly erythematous with no exudate. Ears normal. Eyes normal. Eyes:     Conjunctiva/sclera: Conjunctivae normal.  Cardiovascular:     Rate and Rhythm:  Normal rate and regular rhythm.     Heart sounds: Normal heart sounds. No murmur heard. Pulmonary:     Effort: Pulmonary effort is normal.     Breath sounds: Normal breath sounds.     Comments: Lungs clear to auscultation Musculoskeletal:        General: Normal range of motion.     Cervical back: Normal range of motion and neck supple.  Skin:    General: Skin is warm and dry.  Neurological:     Mental Status: He is alert and oriented to person, place, and time.  Psychiatric:        Mood  and Affect: Mood normal.        Behavior: Behavior normal.        Thought Content: Thought content normal.        Judgment: Judgment normal.     Diagnostics: FVC 4.60, FEV1 3.08.  Predicted FVC 6.03, predicted FEV1 4.81.  Spirometry indicates possible obstruction and restriction.  Assessment and Plan: 1. Idiopathic urticaria   2. Moderate persistent asthma without complication   3. Seasonal and perennial allergic rhinitis   4. Penicillin allergy   5. Hymenoptera allergy     Meds ordered this encounter  Medications   Fluticasone Propionate (XHANCE) 93 MCG/ACT EXHU    Sig: Place 2 sprays into the nose in the morning and at bedtime.    Dispense:  32 mL    Refill:  3   EPINEPHrine 0.3 mg/0.3 mL IJ SOAJ injection    Sig: Inject 0.3 mg into the muscle once as needed for anaphylaxis.    Dispense:  2 each    Refill:  1    Please dispense Mylan or Teva brand generic only. Thank you.    Patient Instructions  Asthma Continue Symbicort 160-2 puffs twice a day with a spacer to prevent cough or wheeze You may use albuterol 2 puffs once every 4 hours as needed for cough or wheeze You may use albuterol 5 to 15 minutes before activity to decrease cough or wheeze  Allergic rhinitis Continue allergen avoidance measures directed toward grass pollen, tree pollen, weed pollen, ragweed pollen, mold, dust mite, and dog, as listed below Begin cetirizine 10 mg once a day as needed for a runny nose Begin Xhance-2 puffs in each nostril twice a day.  May need referral to ENT for further evaluation Consider saline nasal rinses as needed for nasal symptoms. Use this before any medicated nasal sprays for best result Consider allergen immunotherapy if your symptoms are not well controlled with the treatment plan as listed above  Hives (urticaria) Take the lease amount of medications while remaining hive free Cetirizine (Zyrtec) 10mg  twice a day and famotidine (Pepcid) 20 mg twice a day. If no symptoms  for 7-14 days then decrease to. Cetirizine (Zyrtec) 10mg  twice a day and famotidine (Pepcid) 20 mg once a day.  If no symptoms for 7-14 days then decrease to. Cetirizine (Zyrtec) 10mg  twice a day.  If no symptoms for 7-14 days then decrease to. Cetirizine (Zyrtec) 10mg  once a day.  May use Benadryl (diphenhydramine) as needed for breakthrough hives       If symptoms return, then step up dosage Keep a detailed symptom journal including foods eaten, contact with allergens, medications taken, weather changes.  We have placed some lab work to help evaluate your hives. We will call you when the results become available  Stinging insect allergy Avoid stinging insects. We have ordered a lab  to help [place] evaluate your stinging insect allergy  In case of an allergic reaction, take Benadryl 50 mg every 4 hours, and if life-threatening symptoms occur, inject with EpiPen 0.3 mg.  Call the clinic if this treatment plan is not working well for you  Follow up in 1 month or sooner if needed.   Return in about 4 weeks (around 02/17/2022).    Thank you for the opportunity to care for this patient.  Please do not hesitate to contact me with questions.  Gareth Morgan, FNP [facility]

## 2022-01-20 NOTE — Patient Instructions (Addendum)
Asthma Continue Symbicort 160-2 puffs twice a day with a spacer to prevent cough or wheeze You may use albuterol 2 puffs once every 4 hours as needed for cough or wheeze You may use albuterol 5 to 15 minutes before activity to decrease cough or wheeze  Allergic rhinitis Continue allergen avoidance measures directed toward grass pollen, tree pollen, weed pollen, ragweed pollen, mold, dust mite, and dog, as listed below Begin cetirizine 10 mg once a day as needed for a runny nose Begin Xhance-2 puffs in each nostril twice a day.  May need referral to ENT for further evaluation Consider saline nasal rinses as needed for nasal symptoms. Use this before any medicated nasal sprays for best result Consider allergen immunotherapy if your symptoms are not well controlled with the treatment plan as listed above  Hives (urticaria) Take the lease amount of medications while remaining hive free Cetirizine (Zyrtec) 10mg  twice a day and famotidine (Pepcid) 20 mg twice a day. If no symptoms for 7-14 days then decrease to. Cetirizine (Zyrtec) 10mg  twice a day and famotidine (Pepcid) 20 mg once a day.  If no symptoms for 7-14 days then decrease to. Cetirizine (Zyrtec) 10mg  twice a day.  If no symptoms for 7-14 days then decrease to. Cetirizine (Zyrtec) 10mg  once a day.  May use Benadryl (diphenhydramine) as needed for breakthrough hives       If symptoms return, then step up dosage Keep a detailed symptom journal including foods eaten, contact with allergens, medications taken, weather changes.  We have placed some lab work to help evaluate your hives. We will call you when the results become available  Stinging insect allergy Avoid stinging insects. We have ordered a lab to help evaluate your stinging insect allergy  In case of an allergic reaction, take Benadryl 50 mg every 4 hours, and if life-threatening symptoms occur, inject with EpiPen 0.3 mg.  Call the clinic if this treatment plan is not  working well for you  Follow up in 1 month or sooner if needed.  Reducing Pollen Exposure The American Academy of Allergy, Asthma and Immunology suggests the following steps to reduce your exposure to pollen during allergy seasons. Do not hang sheets or clothing out to dry; pollen may collect on these items. Do not mow lawns or spend time around freshly cut grass; mowing stirs up pollen. Keep windows closed at night.  Keep car windows closed while driving. Minimize morning activities outdoors, a time when pollen counts are usually at their highest. Stay indoors as much as possible when pollen counts or humidity is high and on windy days when pollen tends to remain in the air longer. Use air conditioning when possible.  Many air conditioners have filters that trap the pollen spores. Use a HEPA room air filter to remove pollen form the indoor air you breathe.   Control of Dust Mite Allergen Dust mites play a major role in allergic asthma and rhinitis. They occur in environments with high humidity wherever human skin is found. Dust mites absorb humidity from the atmosphere (ie, they do not drink) and feed on organic matter (including shed human and animal skin). Dust mites are a microscopic type of insect that you cannot see with the naked eye. High levels of dust mites have been detected from mattresses, pillows, carpets, upholstered furniture, bed covers, clothes, soft toys and any woven material. The principal allergen of the dust mite is found in its feces. A gram of dust may contain 1,000 mites  and 250,000 fecal particles. Mite antigen is easily measured in the air during house cleaning activities. Dust mites do not bite and do not cause harm to humans, other than by triggering allergies/asthma.  Ways to decrease your exposure to dust mites in your home:  1. Encase mattresses, box springs and pillows with a mite-impermeable barrier or cover  2. Wash sheets, blankets and drapes weekly in hot  water (130 F) with detergent and dry them in a dryer on the hot setting.  3. Have the room cleaned frequently with a vacuum cleaner and a damp dust-mop. For carpeting or rugs, vacuuming with a vacuum cleaner equipped with a high-efficiency particulate air (HEPA) filter. The dust mite allergic individual should not be in a room which is being cleaned and should wait 1 hour after cleaning before going into the room.  4. Do not sleep on upholstered furniture (eg, couches).  5. If possible removing carpeting, upholstered furniture and drapery from the home is ideal. Horizontal blinds should be eliminated in the rooms where the person spends the most time (bedroom, study, television room). Washable vinyl, roller-type shades are optimal.  6. Remove all non-washable stuffed toys from the bedroom. Wash stuffed toys weekly like sheets and blankets above.  7. Reduce indoor humidity to less than 50%. Inexpensive humidity monitors can be purchased at most hardware stores. Do not use a humidifier as can make the problem worse and are not recommended.  Control of Dog or Cat Allergen Avoidance is the best way to manage a dog or cat allergy. If you have a dog or cat and are allergic to dog or cats, consider removing the dog or cat from the home. If you have a dog or cat but don't want to find it a new home, or if your family wants a pet even though someone in the household is allergic, here are some strategies that may help keep symptoms at bay:  Keep the pet out of your bedroom and restrict it to only a few rooms. Be advised that keeping the dog or cat in only one room will not limit the allergens to that room. Don't pet, hug or kiss the dog or cat; if you do, wash your hands with soap and water. High-efficiency particulate air (HEPA) cleaners run continuously in a bedroom or living room can reduce allergen levels over time. Regular use of a high-efficiency vacuum cleaner or a central vacuum can reduce allergen  levels. Giving your dog or cat a bath at least once a week can reduce airborne allergen.  Control of Mold Allergen Mold and fungi can grow on a variety of surfaces provided certain temperature and moisture conditions exist.  Outdoor molds grow on plants, decaying vegetation and soil.  The major outdoor mold, Alternaria and Cladosporium, are found in very high numbers during hot and dry conditions.  Generally, a late Summer - Fall peak is seen for common outdoor fungal spores.  Rain will temporarily lower outdoor mold spore count, but counts rise rapidly when the rainy period ends.  The most important indoor molds are Aspergillus and Penicillium.  Dark, humid and poorly ventilated basements are ideal sites for mold growth.  The next most common sites of mold growth are the bathroom and the kitchen.  Outdoor [employer] Use air conditioning and keep windows closed Avoid exposure to decaying vegetation. Avoid leaf raking. Avoid grain handling. Consider wearing a face mask if working in moldy areas.  Indoor Mold Control Maintain humidity below 50%. Clean  washable surfaces with 5% bleach solution. Remove sources e.g. Contaminated carpets.

## 2022-01-20 NOTE — Telephone Encounter (Signed)
Per provider -   Called [facility] - DOB verified - added Chronic Urticaria Panel Test due to original test was entered as Clinic collect and not Lab Collect.   [facility] added test - will fax add on form to be signed by provider and faxed back.

## 2022-01-24 LAB — ALLERGEN HYMENOPTERA PANEL
Bumblebee: <0.1 kU/L
Honeybee IgE: <0.1 kU/L
Hornet, White Face, IgE: <0.1 kU/L
Hornet, Yellow, IgE: <0.1 kU/L
Paper Wasp IgE: <0.1 kU/L
Yellow Jacket, IgE: <0.1 kU/L

## 2022-01-28 ENCOUNTER — Encounter: Payer: Self-pay | Admitting: Family Medicine

## 2022-01-28 LAB — ALPHA-GAL PANEL
Allergen Lamb IgE: <0.1 kU/L
Beef IgE: <0.1 kU/L
IgE (Immunoglobulin E), Serum: 79 IU/mL (ref 6–495)
O215-IgE Alpha-Gal: <0.1 kU/L
Pork IgE: <0.1 kU/L

## 2022-01-28 LAB — CBC WITH DIFFERENTIAL/PLATELET
Basophils Absolute: 0 10*3/uL (ref 0.0–0.2)
Basos: 1 %
EOS (ABSOLUTE): 0.5 10*3/uL — ABNORMAL HIGH (ref 0.0–0.4)
Eos: 7 %
Hematocrit: 47.5 % (ref 37.5–51.0)
Hemoglobin: 16.2 g/dL (ref 13.0–17.7)
Immature Grans (Abs): 0 10*3/uL (ref 0.0–0.1)
Immature Granulocytes: 0 %
Lymphocytes Absolute: 3 10*3/uL (ref 0.7–3.1)
Lymphs: 42 %
MCH: 31.8 pg (ref 26.6–33.0)
MCHC: 34.1 g/dL (ref 31.5–35.7)
MCV: 93 fL (ref 79–97)
Monocytes Absolute: 0.7 10*3/uL (ref 0.1–0.9)
Monocytes: 10 %
Neutrophils Absolute: 2.9 10*3/uL (ref 1.4–7.0)
Neutrophils: 40 %
Platelets: 160 10*3/uL (ref 150–450)
RBC: 5.09 x10E6/uL (ref 4.14–5.80)
RDW: 13 % (ref 11.6–15.4)
WBC: 7.2 10*3/uL (ref 3.4–10.8)

## 2022-01-28 LAB — CMP14+EGFR
ALT: 34 IU/L (ref 0–44)
AST: 17 IU/L (ref 0–40)
Albumin/Globulin Ratio: 1.8 (ref 1.2–2.2)
Albumin: 4.3 g/dL (ref 4.1–5.1)
Alkaline Phosphatase: 92 IU/L (ref 44–121)
BUN/Creatinine Ratio: 17 (ref 9–20)
BUN: 16 mg/dL (ref 6–20)
Bilirubin Total: 0.5 mg/dL (ref 0.0–1.2)
CO2: 18 mmol/L — ABNORMAL LOW (ref 20–29)
Calcium: 9 mg/dL (ref 8.7–10.2)
Chloride: 106 mmol/L (ref 96–106)
Creatinine, Ser: 0.95 mg/dL (ref 0.76–1.27)
Globulin, Total: 2.4 g/dL (ref 1.5–4.5)
Glucose: 125 mg/dL — ABNORMAL HIGH (ref 70–99)
Potassium: 4.2 mmol/L (ref 3.5–5.2)
Sodium: 143 mmol/L (ref 134–144)
Total Protein: 6.7 g/dL (ref 6.0–8.5)
eGFR: 104 mL/min/{1.73_m2} (ref >59)

## 2022-01-28 LAB — THYROID PEROXIDASE ANTIBODY: Thyroperoxidase Ab SerPl-aCnc: 78 IU/mL — ABNORMAL HIGH (ref 0–34)

## 2022-01-28 LAB — TRYPTASE: Tryptase: 8.5 ug/L (ref 2.2–13.2)

## 2022-01-28 LAB — THYROGLOBULIN LEVEL: Thyroglobulin (TG-RIA): 8 ng/mL

## 2022-01-28 LAB — C4 COMPLEMENT: Complement C4, Serum: 15 mg/dL (ref 12–38)

## 2022-01-28 NOTE — Telephone Encounter (Signed)
Can you please let this patient know that we are still waiting on one more result. Thank you

## 2022-02-02 ENCOUNTER — Other Ambulatory Visit: Payer: Self-pay | Admitting: Family Medicine

## 2022-02-02 MED ORDER — EPINEPHRINE 0.3 MG/0.3ML IJ SOAJ: 0.3000 mg | Freq: Once | INTRAMUSCULAR | 1 refills | Status: DC | PRN

## 2022-02-02 MED ORDER — CETIRIZINE HCL 10 MG PO TABS: 10.0000 mg | ORAL_TABLET | Freq: Every day | ORAL | 5 refills | Status: AC

## 2022-02-02 MED ORDER — XHANCE 93 MCG/ACT NA EXHU: 2.0000 | INHALANT_SUSPENSION | Freq: Two times a day (BID) | NASAL | 5 refills | Status: DC

## 2022-02-02 NOTE — Progress Notes (Signed)
Reports that he had an episode on Sunday where he began to experience full body hives and some shortness of breath.  He took Benadryl with relief of symptoms.  He reports that he continues to experience random hives.  He continues to carry to epinephrine pens at all time.

## 2022-02-03 LAB — CHRONIC URTICARIA: cu index: <6.3 (ref <10)

## 2022-02-03 NOTE — Progress Notes (Signed)
Patient notified of lab results. Patient agreeable to switch from Dupixent to Xolair for relief of chronic urticaria

## 2022-02-16 ENCOUNTER — Telehealth: Payer: Self-pay | Admitting: *Deleted

## 2022-02-16 MED ORDER — FASENRA PEN 30 MG/ML ~~LOC~~ SOAJ: 30.0000 mg | SUBCUTANEOUS | 9 refills | Status: DC

## 2022-02-16 MED ORDER — OMALIZUMAB 150 MG/ML ~~LOC~~ SOSY: 300.0000 mg | PREFILLED_SYRINGE | SUBCUTANEOUS | 11 refills | Status: DC

## 2022-02-16 NOTE — Telephone Encounter (Signed)
-----   Message from Dara Hoyer, FNP sent at 02/03/2022  5:20 PM EST ----- Hi there Troy Lyons, Can you please switch this patient from [place] to [place] for chronic urticaria control please. Thank you

## 2022-02-16 NOTE — Telephone Encounter (Signed)
Called patient and spoke to him regarding Xolair,. He advises same needs to go thru [place] so Rx sent to [facility] and will be on lookout for approval info to be faxed. I did advise patient that due to [place] issues he will need to followup though ordering same. Will reach out once he has delivery set to start therapy

## 2022-02-17 NOTE — Telephone Encounter (Signed)
Thanks, Tam Tam!   Haillee Johann, MD [facility]  

## 2022-02-22 ENCOUNTER — Other Ambulatory Visit: Payer: Self-pay

## 2022-02-22 ENCOUNTER — Emergency Department (HOSPITAL_COMMUNITY)
Admission: EM | Admit: 2022-02-22 | Discharge: 2022-02-22 | Disposition: A | Discharge status: Home / Self Care | Payer: No Typology Code available for payment source | Attending: Emergency Medicine | Admitting: Emergency Medicine

## 2022-02-22 DIAGNOSIS — J45909 Unspecified asthma, uncomplicated: Secondary | ICD-10-CM | POA: Diagnosis not present

## 2022-02-22 DIAGNOSIS — K429 Umbilical hernia without obstruction or gangrene: Secondary | ICD-10-CM | POA: Diagnosis not present

## 2022-02-22 DIAGNOSIS — R109 Unspecified abdominal pain: Secondary | ICD-10-CM | POA: Diagnosis present

## 2022-02-22 LAB — URINALYSIS, ROUTINE W REFLEX MICROSCOPIC
Bilirubin Urine: NEGATIVE
Glucose, UA: NEGATIVE mg/dL
Hgb urine dipstick: NEGATIVE
Ketones, ur: NEGATIVE mg/dL
Leukocytes,Ua: NEGATIVE
Nitrite: NEGATIVE
Protein, ur: NEGATIVE mg/dL
Specific Gravity, Urine: 1.024 (ref 1.005–1.030)
pH: 6 (ref 5.0–8.0)

## 2022-02-22 LAB — COMPREHENSIVE METABOLIC PANEL
ALT: 47 U/L — ABNORMAL HIGH (ref 0–44)
AST: 26 U/L (ref 15–41)
Albumin: 4.9 g/dL (ref 3.5–5.0)
Alkaline Phosphatase: 67 U/L (ref 38–126)
Anion gap: 6 (ref 5–15)
BUN: 15 mg/dL (ref 6–20)
CO2: 24 mmol/L (ref 22–32)
Calcium: 8.9 mg/dL (ref 8.9–10.3)
Chloride: 109 mmol/L (ref 98–111)
Creatinine, Ser: 0.97 mg/dL (ref 0.61–1.24)
GFR, Estimated: >60 mL/min (ref >60)
Glucose, Bld: 99 mg/dL (ref 70–99)
Potassium: 3.9 mmol/L (ref 3.5–5.1)
Sodium: 139 mmol/L (ref 135–145)
Total Bilirubin: 0.8 mg/dL (ref 0.3–1.2)
Total Protein: 8 g/dL (ref 6.5–8.1)

## 2022-02-22 LAB — CBC
HCT: 48 % (ref 39.0–52.0)
Hemoglobin: 16.8 g/dL (ref 13.0–17.0)
MCH: 31.9 pg (ref 26.0–34.0)
MCHC: 35 g/dL (ref 30.0–36.0)
MCV: 91.1 fL (ref 80.0–100.0)
Platelets: 173 10*3/uL (ref 150–400)
RBC: 5.27 MIL/uL (ref 4.22–5.81)
RDW: 12.9 % (ref 11.5–15.5)
WBC: 7.8 10*3/uL (ref 4.0–10.5)
nRBC: 0 % (ref 0.0–0.2)

## 2022-02-22 LAB — LIPASE, BLOOD: Lipase: 26 U/L (ref 11–51)

## 2022-02-22 MED ORDER — OXYCODONE-ACETAMINOPHEN 5-325 MG PO TABS: 1.0000 | ORAL_TABLET | Freq: Three times a day (TID) | ORAL | 0 refills | Status: DC | PRN

## 2022-02-22 MED ORDER — OXYCODONE-ACETAMINOPHEN 5-325 MG PO TABS
1.0000 | ORAL_TABLET | Freq: Once | ORAL | Status: AC
  Administered 2022-02-22: 1 via ORAL
  Filled 2022-02-22: qty 1

## 2022-02-22 NOTE — ED Provider Notes (Signed)
[facility] EMERGENCY DEPARTMENT Provider Note   CSN: [id number] Arrival date & time: 02/22/22  1543     History  Chief Complaint  Patient presents with   Abdominal Pain    Troy Lyons is a 42 y.o. male.   Abdominal Pain Patient presents with abdominal pain.  Has had for the last 2 to 3 days.  Swelling is upper abdomen.  Uncomfortable.  Worse with certain movements.  No previous abdominal surgery.  No nausea or vomiting.  States attempted to go to the back at times to the testicle.  No dysuria.    Past Medical History:  Diagnosis Date   Asthma    MS (multiple sclerosis) (HCC)    Urticaria     Home Medications Prior to Admission medications   Medication Sig Start Date End Date Taking? Authorizing Provider  oxyCODONE-acetaminophen (PERCOCET/ROXICET) 5-325 MG tablet Take 1-2 tablets by mouth every 8 (eight) hours as needed for severe pain. 02/22/22  Yes Benjiman Core, MD  albuterol (VENTOLIN HFA) 108 (90 Base) MCG/ACT inhaler 4 puffs every 4-6 hours as needed for shortness of breath or wheezing. 08/16/19   Alfonse Spruce, MD  azithromycin (ZITHROMAX) 250 MG tablet Take by mouth. Patient not taking: Reported on 01/20/2022 05/05/20   [provider]  baclofen (LIORESAL) 10 MG tablet Take 10 mg by mouth 3 (three) times daily.    [provider]  cetirizine (ZYRTEC) 10 MG tablet Take 1 tablet (10 mg total) by mouth daily. 02/02/22   Hetty Blend, FNP  cholecalciferol (VITAMIN D) 1000 UNITS tablet Take 2,000 Units by mouth daily.    [provider]  doxycycline (VIBRAMYCIN) 100 MG capsule Take 100 mg by mouth 2 (two) times daily. Patient not taking: Reported on 01/20/2022 07/26/20   [provider]  EPINEPHrine 0.3 mg/0.3 mL IJ SOAJ injection Inject 0.3 mg into the muscle once as needed for anaphylaxis. 02/02/22   Hetty Blend, FNP  famotidine (PEPCID) 20 MG tablet Take 1 tablet (20 mg total) by mouth 2 (two) times daily. 08/16/19    Alfonse Spruce, MD  Fluticasone Propionate Timmothy Sours) 93 MCG/ACT EXHU Place 2 sprays into the nose in the morning and at bedtime. 01/20/22   Ambs, Norvel Richards, FNP  Fluticasone Propionate Timmothy Sours) 93 MCG/ACT EXHU Place 2 Pump into the nose 2 (two) times daily. 02/02/22   Hetty Blend, FNP  fluticasone-salmeterol (ADVAIR HFA) 010-93 MCG/ACT inhaler Inhale 2 puffs into the lungs 2 (two) times daily. Patient not taking: Reported on 01/20/2022 01/24/20   Alfonse Spruce, MD  gabapentin (NEURONTIN) 300 MG capsule Take 300-600 mg by mouth 4 (four) times daily. Take 600mg  in the morning and afternoon; take 300mg  at noon and bedtime    [provider]  gabapentin (NEURONTIN) 600 MG tablet Take 1,200 mg by mouth 2 (two) times daily. 07/15/20   [provider]  hydrOXYzine (ATARAX/VISTARIL) 25 MG tablet Take 1 tablet (25 mg total) by mouth every 8 (eight) hours as needed for itching. 08/16/19   07/17/20, MD  montelukast (SINGULAIR) 10 MG tablet Take 1 tablet (10 mg total) by mouth every morning. 04/08/20   Alfonse Spruce, MD  natalizumab (TYSABRI) 300 MG/15ML injection Inject 15 mLs into the vein every 30 (thirty) days.    [provider]  omalizumab 04/10/20) 150 MG/ML prefilled syringe Inject 300 mg into the skin every 28 (twenty-eight) days. 02/16/22   Geoffry Paradise, MD  predniSONE (DELTASONE) 20 MG  tablet Take by mouth. Patient not taking: Reported on 01/20/2022 07/26/20   [provider]  rizatriptan (MAXALT-MLT) 10 MG disintegrating tablet Take 10 mg by mouth as needed for migraine. May repeat in 2 hours if needed    [provider]  SYMBICORT 160-4.5 MCG/ACT inhaler Inhale 2 puffs into the lungs in the morning and at bedtime. 08/16/19   Valentina Shaggy, MD  thiamine (VITAMIN B-1) 50 MG tablet Take 50 mg by mouth daily.    [provider]  topiramate (TOPAMAX) 50 MG tablet Take 50 mg by mouth 2 (two) times daily.    [provider]  vitamin C (ASCORBIC ACID) 500 MG tablet Take 500 mg by mouth daily.    [provider]  zinc sulfate 50 MG CAPS capsule Take 220 mg by mouth daily. 12/25/18   [provider]      Allergies    Penicillins, Poison sumac extract, Carbamazepine, and Fingolimod    Review of Systems   Review of Systems  Gastrointestinal:  Positive for abdominal pain.    Physical Exam Updated Vital Signs BP (!) 141/93   Pulse 79   Temp 98.6 F (37 C) (Oral)   Resp 20   SpO2 99%  Physical Exam Vitals and nursing note reviewed.  Cardiovascular:     Rate and Rhythm: Regular rhythm.  Pulmonary:     Breath sounds: Normal breath sounds.  Abdominal:     Comments: Umbilical hernia.  Reducible but tender.  No significant tenderness in other parts the abdomen.  No distention.  Skin:    General: Skin is warm.     Capillary Refill: Capillary refill takes less than 2 seconds.  Neurological:     Mental Status: He is alert.     ED Results / Procedures / Treatments   Labs (all labs ordered are listed, but only abnormal results are displayed) Labs Reviewed  COMPREHENSIVE METABOLIC PANEL - Abnormal; Notable for the following components:      Result Value   ALT 47 (*)    All other components within normal limits  LIPASE, BLOOD  CBC  URINALYSIS, ROUTINE W REFLEX MICROSCOPIC    EKG None  Radiology No results found.  Procedures Procedures    Medications Ordered in ED Medications  oxyCODONE-acetaminophen (PERCOCET/ROXICET) 5-325 MG per tablet 1 tablet (has no administration in time range)    ED Course/ Medical Decision Making/ A&P                           Medical Decision Making Amount and/or Complexity of Data Reviewed Labs: ordered.  Risk Prescription drug management.   Patient with umbilical hernia.  Reducible.  Pain in the area.  Worse with certain movements.  Abdomen is not appear to be severely distended.  I think this is the cause of the pain.   Pain appears more localized.  Doubt obstruction and does not appear to be incarcerated or strangulated.  Discussed with Dr. Arnoldo Morale from general surgery.  He can see in the morning tomorrow at 930.  Appears stable for discharge home.  Does not need CT scan at this time.  Pain medicine given.        Final Clinical Impression(s) / ED Diagnoses Final diagnoses:  Umbilical hernia without obstruction or gangrene    Rx / DC Orders ED Discharge Orders          Ordered    oxyCODONE-acetaminophen (PERCOCET/ROXICET) 5-325 MG tablet  Every 8 hours PRN        02/22/22 Eliane Decree, MD 02/22/22 2009

## 2022-02-22 NOTE — ED Triage Notes (Signed)
Pt c/o abd hernia. Worse the last 2 days. Pain radiating into back and into testicles and left upper abd. Denies any genital swelling. Pt appears uncomfortable. Small soft knot noted just above navel. nad

## 2022-02-22 NOTE — Discharge Instructions (Addendum)
Go to the surgeons office tomorrow between 930 and 10.

## 2022-02-23 ENCOUNTER — Ambulatory Visit (INDEPENDENT_AMBULATORY_CARE_PROVIDER_SITE_OTHER): Payer: No Typology Code available for payment source | Admitting: General Surgery

## 2022-02-23 ENCOUNTER — Encounter: Payer: Self-pay | Admitting: General Surgery

## 2022-02-23 VITALS — BP 130/83 | HR 75 | Temp 97.8°F | Resp 14 | Ht 74.5 in | Wt 290.0 lb

## 2022-02-23 DIAGNOSIS — K429 Umbilical hernia without obstruction or gangrene: Secondary | ICD-10-CM | POA: Diagnosis not present

## 2022-02-23 NOTE — Progress Notes (Signed)
Troy Lyons; [id number]; 04/03/1980   HPI Patient is a 42 year old white male who was referred to my care by the emergency room and Dr. Sherwood Gambler for evaluation and treatment of an umbilical hernia.  He was seen in the emergency room yesterday evening with incarceration and pain of the umbilical hernia.  It started 2 days earlier.  It was reduced in the emergency room but he continues to have periumbilical pain.  He states he has had the umbilical hernia for many years but recently seems to have increased in size and causing him discomfort.  No vomiting. Past Medical History:  Diagnosis Date   Asthma    MS (multiple sclerosis) (HCC)    Urticaria     Past Surgical History:  Procedure Laterality Date   eye damage     KNEE SURGERY     nerve damage,     SINOSCOPY  2015    Family History  Problem Relation Age of Onset   Heart failure Mother    Allergic rhinitis Mother    Heart failure Father    Allergic rhinitis Father     Current Outpatient Medications on File Prior to Visit  Medication Sig Dispense Refill   albuterol (VENTOLIN HFA) 108 (90 Base) MCG/ACT inhaler 4 puffs every 4-6 hours as needed for shortness of breath or wheezing. 18 g 1   baclofen (LIORESAL) 10 MG tablet Take 10 mg by mouth 3 (three) times daily.     cetirizine (ZYRTEC) 10 MG tablet Take 1 tablet (10 mg total) by mouth daily. 60 tablet 5   cholecalciferol (VITAMIN D) 1000 UNITS tablet Take 2,000 Units by mouth daily.     EPINEPHrine 0.3 mg/0.3 mL IJ SOAJ injection Inject 0.3 mg into the muscle once as needed for anaphylaxis. 2 each 1   famotidine (PEPCID) 20 MG tablet Take 1 tablet (20 mg total) by mouth 2 (two) times daily. 180 tablet 1   Fluticasone Propionate (XHANCE) 93 MCG/ACT EXHU Place 2 Pump into the nose 2 (two) times daily. 16 mL 5   gabapentin (NEURONTIN) 600 MG tablet Take 1,200 mg by mouth 2 (two) times daily.     hydrOXYzine (ATARAX/VISTARIL) 25 MG tablet Take 1 tablet (25 mg total) by mouth every 8  (eight) hours as needed for itching. 90 tablet 1   montelukast (SINGULAIR) 10 MG tablet Take 1 tablet (10 mg total) by mouth every morning. 30 tablet 3   natalizumab (TYSABRI) 300 MG/15ML injection Inject 15 mLs into the vein every 30 (thirty) days.     rizatriptan (MAXALT-MLT) 10 MG disintegrating tablet Take 10 mg by mouth as needed for migraine. May repeat in 2 hours if needed     SYMBICORT 160-4.5 MCG/ACT inhaler Inhale 2 puffs into the lungs in the morning and at bedtime. 3 Inhaler 1   thiamine (VITAMIN B-1) 50 MG tablet Take 50 mg by mouth daily.     topiramate (TOPAMAX) 50 MG tablet Take 50 mg by mouth 2 (two) times daily.     vitamin C (ASCORBIC ACID) 500 MG tablet Take 500 mg by mouth daily.     zinc sulfate 50 MG CAPS capsule Take 220 mg by mouth daily.     fluticasone-salmeterol (ADVAIR HFA) 115-21 MCG/ACT inhaler Inhale 2 puffs into the lungs 2 (two) times daily. (Patient not taking: Reported on 01/20/2022) 12 g 5   omalizumab (XOLAIR) 150 MG/ML prefilled syringe Inject 300 mg into the skin every 28 (twenty-eight) days. (Patient not taking: Reported on 02/23/2022)  2 mL 11   oxyCODONE-acetaminophen (PERCOCET/ROXICET) 5-325 MG tablet Take 1-2 tablets by mouth every 8 (eight) hours as needed for severe pain. (Patient not taking: Reported on 02/23/2022) 6 tablet 0   No current facility-administered medications on file prior to visit.    Allergies  Allergen Reactions   Penicillins Anaphylaxis    Has patient had a PCN reaction causing immediate rash, facial/tongue/throat swelling, SOB or lightheadedness with hypotension: Yes Has patient had a PCN reaction causing severe rash involving mucus membranes or skin necrosis: No Has patient had a PCN reaction that required hospitalization No Has patient had a PCN reaction occurring within the last 10 years: No If all of the above answers are "NO", then may proceed with Cephalosporin use.    Poison Sumac Extract Anaphylaxis    Severe blistering and  breathing problems   Carbamazepine Itching   Fingolimod Hives    rash rash     Social History   Substance and Sexual Activity  Alcohol Use Yes   Comment: SOMETIMES    Social History   Tobacco Use  Smoking Status Never  Smokeless Tobacco Current   Types: Chew    Review of Systems  Constitutional: Negative.   HENT:  Positive for sinus pain.   Eyes: Negative.   Respiratory:  Positive for wheezing.   Cardiovascular: Negative.   Gastrointestinal:  Positive for abdominal pain and nausea.  Genitourinary: Negative.   Musculoskeletal: Negative.   Skin: Negative.   Neurological:  Positive for sensory change.  Endo/Heme/Allergies: Negative.   Psychiatric/Behavioral: Negative.      Objective   Vitals:   02/23/22 0940  BP: 130/83  Pulse: 75  Resp: 14  Temp: 97.8 F (36.6 C)  SpO2: 94%    Physical Exam Vitals reviewed.  Constitutional:      Appearance: Normal appearance. He is not ill-appearing.  HENT:     Head: Normocephalic and atraumatic.  Cardiovascular:     Rate and Rhythm: Normal rate and regular rhythm.     Heart sounds: Normal heart sounds. No murmur heard.    No friction rub. No gallop.  Pulmonary:     Effort: Pulmonary effort is normal. No respiratory distress.     Breath sounds: Normal breath sounds. No stridor. No wheezing, rhonchi or rales.  Abdominal:     General: Bowel sounds are normal. There is no distension.     Palpations: Abdomen is soft. There is no mass.     Tenderness: There is abdominal tenderness. There is no guarding or rebound.     Hernia: A hernia is present.     Comments: Tender reducible 3.5 cm umbilical hernia.  Skin:    General: Skin is warm and dry.  Neurological:     Mental Status: He is alert and oriented to person, place, and time.    ER notes reviewed Assessment  Umbilical hernia, reducible, symptomatic Plan  Patient is scheduled for robotic assisted laparoscopic umbilical herniorrhaphy with mesh on 02/26/2022.  The  risks and benefits of the procedure including bleeding, infection, bowel injury, mesh use, and the possibility of recurrence of the hernia were fully explained to the patient, who gave informed consent.

## 2022-02-23 NOTE — H&P (Signed)
Troy Lyons; [id number]; 02/02/1981   HPI Patient is a 42-year-old white male who was referred to my care by the emergency room and Dr. Fusco for evaluation and treatment of an umbilical hernia.  He was seen in the emergency room yesterday evening with incarceration and pain of the umbilical hernia.  It started 2 days earlier.  It was reduced in the emergency room but he continues to have periumbilical pain.  He states he has had the umbilical hernia for many years but recently seems to have increased in size and causing him discomfort.  No vomiting. Past Medical History:  Diagnosis Date   Asthma    MS (multiple sclerosis) (HCC)    Urticaria     Past Surgical History:  Procedure Laterality Date   eye damage     KNEE SURGERY     nerve damage,     SINOSCOPY  2015    Family History  Problem Relation Age of Onset   Heart failure Mother    Allergic rhinitis Mother    Heart failure Father    Allergic rhinitis Father     Current Outpatient Medications on File Prior to Visit  Medication Sig Dispense Refill   albuterol (VENTOLIN HFA) 108 (90 Base) MCG/ACT inhaler 4 puffs every 4-6 hours as needed for shortness of breath or wheezing. 18 g 1   baclofen (LIORESAL) 10 MG tablet Take 10 mg by mouth 3 (three) times daily.     cetirizine (ZYRTEC) 10 MG tablet Take 1 tablet (10 mg total) by mouth daily. 60 tablet 5   cholecalciferol (VITAMIN D) 1000 UNITS tablet Take 2,000 Units by mouth daily.     EPINEPHrine 0.3 mg/0.3 mL IJ SOAJ injection Inject 0.3 mg into the muscle once as needed for anaphylaxis. 2 each 1   famotidine (PEPCID) 20 MG tablet Take 1 tablet (20 mg total) by mouth 2 (two) times daily. 180 tablet 1   Fluticasone Propionate (XHANCE) 93 MCG/ACT EXHU Place 2 Pump into the nose 2 (two) times daily. 16 mL 5   gabapentin (NEURONTIN) 600 MG tablet Take 1,200 mg by mouth 2 (two) times daily.     hydrOXYzine (ATARAX/VISTARIL) 25 MG tablet Take 1 tablet (25 mg total) by mouth every 8  (eight) hours as needed for itching. 90 tablet 1   montelukast (SINGULAIR) 10 MG tablet Take 1 tablet (10 mg total) by mouth every morning. 30 tablet 3   natalizumab (TYSABRI) 300 MG/15ML injection Inject 15 mLs into the vein every 30 (thirty) days.     rizatriptan (MAXALT-MLT) 10 MG disintegrating tablet Take 10 mg by mouth as needed for migraine. May repeat in 2 hours if needed     SYMBICORT 160-4.5 MCG/ACT inhaler Inhale 2 puffs into the lungs in the morning and at bedtime. 3 Inhaler 1   thiamine (VITAMIN B-1) 50 MG tablet Take 50 mg by mouth daily.     topiramate (TOPAMAX) 50 MG tablet Take 50 mg by mouth 2 (two) times daily.     vitamin C (ASCORBIC ACID) 500 MG tablet Take 500 mg by mouth daily.     zinc sulfate 50 MG CAPS capsule Take 220 mg by mouth daily.     fluticasone-salmeterol (ADVAIR HFA) 115-21 MCG/ACT inhaler Inhale 2 puffs into the lungs 2 (two) times daily. (Patient not taking: Reported on 01/20/2022) 12 g 5   omalizumab (XOLAIR) 150 MG/ML prefilled syringe Inject 300 mg into the skin every 28 (twenty-eight) days. (Patient not taking: Reported on 02/23/2022)   2 mL 11   oxyCODONE-acetaminophen (PERCOCET/ROXICET) 5-325 MG tablet Take 1-2 tablets by mouth every 8 (eight) hours as needed for severe pain. (Patient not taking: Reported on 02/23/2022) 6 tablet 0   No current facility-administered medications on file prior to visit.    Allergies  Allergen Reactions   Penicillins Anaphylaxis    Has patient had a PCN reaction causing immediate rash, facial/tongue/throat swelling, SOB or lightheadedness with hypotension: Yes Has patient had a PCN reaction causing severe rash involving mucus membranes or skin necrosis: No Has patient had a PCN reaction that required hospitalization No Has patient had a PCN reaction occurring within the last 10 years: No If all of the above answers are "NO", then may proceed with Cephalosporin use.    Poison Sumac Extract Anaphylaxis    Severe blistering and  breathing problems   Carbamazepine Itching   Fingolimod Hives    rash rash     Social History   Substance and Sexual Activity  Alcohol Use Yes   Comment: SOMETIMES    Social History   Tobacco Use  Smoking Status Never  Smokeless Tobacco Current   Types: Chew    Review of Systems  Constitutional: Negative.   HENT:  Positive for sinus pain.   Eyes: Negative.   Respiratory:  Positive for wheezing.   Cardiovascular: Negative.   Gastrointestinal:  Positive for abdominal pain and nausea.  Genitourinary: Negative.   Musculoskeletal: Negative.   Skin: Negative.   Neurological:  Positive for sensory change.  Endo/Heme/Allergies: Negative.   Psychiatric/Behavioral: Negative.      Objective   Vitals:   02/23/22 0940  BP: 130/83  Pulse: 75  Resp: 14  Temp: 97.8 F (36.6 C)  SpO2: 94%    Physical Exam Vitals reviewed.  Constitutional:      Appearance: Normal appearance. He is not ill-appearing.  HENT:     Head: Normocephalic and atraumatic.  Cardiovascular:     Rate and Rhythm: Normal rate and regular rhythm.     Heart sounds: Normal heart sounds. No murmur heard.    No friction rub. No gallop.  Pulmonary:     Effort: Pulmonary effort is normal. No respiratory distress.     Breath sounds: Normal breath sounds. No stridor. No wheezing, rhonchi or rales.  Abdominal:     General: Bowel sounds are normal. There is no distension.     Palpations: Abdomen is soft. There is no mass.     Tenderness: There is abdominal tenderness. There is no guarding or rebound.     Hernia: A hernia is present.     Comments: Tender reducible 3.5 cm umbilical hernia.  Skin:    General: Skin is warm and dry.  Neurological:     Mental Status: He is alert and oriented to person, place, and time.    ER notes reviewed Assessment  Umbilical hernia, reducible, symptomatic Plan  Patient is scheduled for robotic assisted laparoscopic umbilical herniorrhaphy with mesh on 02/26/2022.  The  risks and benefits of the procedure including bleeding, infection, bowel injury, mesh use, and the possibility of recurrence of the hernia were fully explained to the patient, who gave informed consent.

## 2022-02-23 NOTE — Patient Instructions (Signed)
Troy Lyons  02/23/2022     @PREFPERIOPPHARMACY @   Your procedure is scheduled on  02/26/2022.   Report to [facility] at  1030  A.M.   Call this number if you have problems the morning of surgery:  [phone]  If you experience any cold or flu symptoms such as cough, fever, chills, shortness of breath, etc. between now and your scheduled surgery, please notify us at the above number.   Remember:  Do not eat or drink after midnight.        Use your inhalers before you come and bring your rescue inhaler with you.     Take these medicines the morning of surgery with A SIP OF WATER           baclofen, zyrtec, pepcid, gabapentin, hydroxyzine, singulair, oxycodone(if needed), maxalt(if needed), topamax.     Do not wear jewelry, make-up or nail polish.  Do not wear lotions, powders, or perfumes, or deodorant.  Do not shave 48 hours prior to surgery.  Men may shave face and neck.  Do not bring valuables to the hospital.  [facility] is not responsible for any belongings or valuables.  Contacts, dentures or bridgework may not be worn into surgery.  Leave your suitcase in the car.  After surgery it may be brought to your room.  For patients admitted to the hospital, discharge time will be determined by your treatment team.  Patients discharged the day of surgery will not be allowed to drive home and must have someone with them for 24 hours.    Special instructions:   DO NOT smoke tobacco or vape for 24 hours before your procedure.  Please read over the following fact sheets that you were given. Pain Booklet, Coughing and Deep Breathing, Blood Transfusion Information, Surgical Site Infection Prevention, Anesthesia Post-op Instructions, and Care and Recovery After Surgery      Laparoscopic Ventral Hernia Repair, Care After The following information offers guidance on how to care for yourself after your procedure. Your health care provider may also give you more  specific instructions. If you have problems or questions, contact your health care provider. What can I expect after the procedure? After the procedure, it is common to have pain, discomfort, or soreness. Follow these instructions at home: Medicines Take over-the-counter and prescription medicines only as told by your health care provider. Ask your health care provider if the medicine prescribed to you: Requires you to avoid driving or using machinery. Can cause constipation. You may need to take these actions to prevent or treat constipation: Drink enough fluid to keep your urine pale yellow. Take over-the-counter or prescription medicines. Eat foods that are high in fiber, such as beans, whole grains, and fresh fruits and vegetables. Limit foods that are high in fat and processed sugars, such as fried or sweet foods. Incision care  Follow instructions from your health care provider about how to take care of your incisions. Make sure you: Wash your hands with soap and water for at least 20 seconds before and after you change your bandage (dressing) or before you touch your abdomen. If soap and water are not available, use hand sanitizer. Change your dressing as told by your health care provider. Leave stitches (sutures), skin glue, or adhesive strips in place. These skin closures may need to stay in place for 2 weeks or longer. If adhesive strip edges start to loosen and curl up, you may trim the  loose edges. Do not remove adhesive strips completely unless your health care provider tells you to do that. Check your incision areas every day for signs of infection. Check for: More redness, swelling, or pain. Fluid or blood. Warmth. Pus or a bad smell. Bathing  Do not take baths, swim, or use a hot tub until your health care provider approves. Ask your health care provider if you may take showers. You may only be allowed to take sponge baths. Keep your dressing dry until your health care  provider says it can be removed. Activity  Rest as told by your health care provider. Avoid sitting for a long time without moving. Get up to take short walks every 1-2 hours. This is important to improve blood flow and breathing. Ask for help if you feel weak or unsteady. Do not lift anything that is heavier than 10 lb (4.5 kg), or the limit that you are told, until your health care provider says that it is safe. If you were given a sedative during the procedure, it can affect you for several hours. Do not drive or operate machinery until your health care provider says that it is safe. Return to your normal activities as told by your health care provider. Ask your health care provider what activities are safe for you. General instructions  Hold a pillow over your abdomen when you cough or sneeze. This helps with pain. Do not use any products that contain nicotine or tobacco. These products include cigarettes, chewing tobacco, and vaping devices, such as e-cigarettes. These can delay healing after surgery. If you need help quitting, ask your health care provider. You may be asked to continue to do deep breathing exercises at home. This will help to prevent a lung infection. Keep all follow-up visits. This is important. Contact a health care provider if: You have any of these signs of infection: More redness, swelling, or pain around an incision. Fluid or blood coming from an incision. Warmth coming from an incision. Pus or a bad smell coming from an incision. A fever or chills. You have pain that gets worse or does not get better with medicine. You have nausea or vomiting. You have a cough. You have shortness of breath. You have not had a bowel movement in 3 days. You are not able to urinate. Get help right away if you have: Severe pain in your abdomen. Persistent nausea and vomiting. Redness, warmth, or pain in your leg. Chest pain. Trouble breathing. These symptoms may represent a  serious problem that is an emergency. Do not wait to see if the symptoms will go away. Get medical help right away. Call your local emergency services (911 in the [place] Do not drive yourself to the hospital. Summary After this procedure, it is common to have pain, discomfort, or soreness. Follow instructions from your health care provider about how to take care of your incision. Check your incision area every day for signs of infection. Report any signs of infection to your health care provider. Keep all follow-up visits. This is important. This information is not intended to replace advice given to you by your health care provider. Make sure you discuss any questions you have with your health care provider. Document Revised: 09/21/2019 Document Reviewed: 09/21/2019 Elsevier Patient Education  2023 Elsevier Inc. General Anesthesia, Adult, Care After The following information offers guidance on how to care for yourself after your procedure. Your health care provider may also give you more specific instructions. If you have  problems or questions, contact your health care provider. What can I expect after the procedure? After the procedure, it is common for people to: Have pain or discomfort at the IV site. Have nausea or vomiting. Have a sore throat or hoarseness. Have trouble concentrating. Feel cold or chills. Feel weak, sleepy, or tired (fatigue). Have soreness and body aches. These can affect parts of the body that were not involved in surgery. Follow these instructions at home: For the time period you were told by your health care provider:  Rest. Do not participate in activities where you could fall or become injured. Do not drive or use machinery. Do not drink alcohol. Do not take sleeping pills or medicines that cause drowsiness. Do not make important decisions or sign legal documents. Do not take care of children on your own. General instructions Drink enough fluid to keep  your urine pale yellow. If you have sleep apnea, surgery and certain medicines can increase your risk for breathing problems. Follow instructions from your health care provider about wearing your sleep device: Anytime you are sleeping, including during daytime naps. While taking prescription pain medicines, sleeping medicines, or medicines that make you drowsy. Return to your normal activities as told by your health care provider. Ask your health care provider what activities are safe for you. Take over-the-counter and prescription medicines only as told by your health care provider. Do not use any products that contain nicotine or tobacco. These products include cigarettes, chewing tobacco, and vaping devices, such as e-cigarettes. These can delay incision healing after surgery. If you need help quitting, ask your health care provider. Contact a health care provider if: You have nausea or vomiting that does not get better with medicine. You vomit every time you eat or drink. You have pain that does not get better with medicine. You cannot urinate or have bloody urine. You develop a skin rash. You have a fever. Get help right away if: You have trouble breathing. You have chest pain. You vomit blood. These symptoms may be an emergency. Get help right away. Call 911. Do not wait to see if the symptoms will go away. Do not drive yourself to the hospital. Summary After the procedure, it is common to have a sore throat, hoarseness, nausea, vomiting, or to feel weak, sleepy, or fatigue. For the time period you were told by your health care provider, do not drive or use machinery. Get help right away if you have difficulty breathing, have chest pain, or vomit blood. These symptoms may be an emergency. This information is not intended to replace advice given to you by your health care provider. Make sure you discuss any questions you have with your health care provider. Document Revised: 05/01/2021  Document Reviewed: 05/01/2021 Elsevier Patient Education  2023 Else[employer] Inc. How to Use Chlorhexidine Before Surgery Chlorhexidine gluconate (CHG) is a germ-killing (antiseptic) solution that is used to clean the skin. It can get rid of the bacteria that normally live on the skin and can keep them away for about 24 hours. To clean your skin with CHG, you may be given: A CHG solution to use in the shower or as part of a sponge bath. A prepackaged cloth that contains CHG. Cleaning your skin with CHG may help lower the risk for infection: While you are staying in the intensive care unit of the hospital. If you have a vascular access, such as a central line, to provide short-term or long-term access to your veins. If you  have a catheter to drain urine from your bladder. If you are on a ventilator. A ventilator is a machine that helps you breathe by moving air in and out of your lungs. After surgery. What are the risks? Risks of using CHG include: A skin reaction. Hearing loss, if CHG gets in your ears and you have a perforated eardrum. Eye injury, if CHG gets in your eyes and is not rinsed out. The CHG product catching fire. Make sure that you avoid smoking and flames after applying CHG to your skin. Do not use CHG: If you have a chlorhexidine allergy or have previously reacted to chlorhexidine. On babies younger than 77 months of age. How to use CHG solution Use CHG only as told by your health care provider, and follow the instructions on the label. Use the full amount of CHG as directed. Usually, this is one bottle. During a shower Follow these steps when using CHG solution during a shower (unless your health care provider gives you different instructions): Start the shower. Use your normal soap and shampoo to wash your face and hair. Turn off the shower or move out of the shower stream. Pour the CHG onto a clean washcloth. Do not use any type of brush or rough-edged sponge. Starting at  your neck, lather your body down to your toes. Make sure you follow these instructions: If you will be having surgery, pay special attention to the part of your body where you will be having surgery. Scrub this area for at least 1 minute. Do not use CHG on your head or face. If the solution gets into your ears or eyes, rinse them well with water. Avoid your genital area. Avoid any areas of skin that have broken skin, cuts, or scrapes. Scrub your back and under your arms. Make sure to wash skin folds. Let the lather sit on your skin for 1-2 minutes or as long as told by your health care provider. Thoroughly rinse your entire body in the shower. Make sure that all body creases and crevices are rinsed well. Dry off with a clean towel. Do not put any substances on your body afterward--such as powder, lotion, or perfume--unless you are told to do so by your health care provider. Only use lotions that are recommended by the manufacturer. Put on clean clothes or pajamas. If it is the night before your surgery, sleep in clean sheets.  During a sponge bath Follow these steps when using CHG solution during a sponge bath (unless your health care provider gives you different instructions): Use your normal soap and shampoo to wash your face and hair. Pour the CHG onto a clean washcloth. Starting at your neck, lather your body down to your toes. Make sure you follow these instructions: If you will be having surgery, pay special attention to the part of your body where you will be having surgery. Scrub this area for at least 1 minute. Do not use CHG on your head or face. If the solution gets into your ears or eyes, rinse them well with water. Avoid your genital area. Avoid any areas of skin that have broken skin, cuts, or scrapes. Scrub your back and under your arms. Make sure to wash skin folds. Let the lather sit on your skin for 1-2 minutes or as long as told by your health care provider. Using a  different clean, wet washcloth, thoroughly rinse your entire body. Make sure that all body creases and crevices are rinsed well. Dry off  with a clean towel. Do not put any substances on your body afterward--such as powder, lotion, or perfume--unless you are told to do so by your health care provider. Only use lotions that are recommended by the manufacturer. Put on clean clothes or pajamas. If it is the night before your surgery, sleep in clean sheets. How to use CHG prepackaged cloths Only use CHG cloths as told by your health care provider, and follow the instructions on the label. Use the CHG cloth on clean, dry skin. Do not use the CHG cloth on your head or face unless your health care provider tells you to. When washing with the CHG cloth: Avoid your genital area. Avoid any areas of skin that have broken skin, cuts, or scrapes. Before surgery Follow these steps when using a CHG cloth to clean before surgery (unless your health care provider gives you different instructions): Using the CHG cloth, vigorously scrub the part of your body where you will be having surgery. Scrub using a back-and-forth motion for 3 minutes. The area on your body should be completely wet with CHG when you are done scrubbing. Do not rinse. Discard the cloth and let the area air-dry. Do not put any substances on the area afterward, such as powder, lotion, or perfume. Put on clean clothes or pajamas. If it is the night before your surgery, sleep in clean sheets.  For general bathing Follow these steps when using CHG cloths for general bathing (unless your health care provider gives you different instructions). Use a separate CHG cloth for each area of your body. Make sure you wash between any folds of skin and between your fingers and toes. Wash your body in the following order, switching to a new cloth after each step: The front of your neck, shoulders, and chest. Both of your arms, under your arms, and your  hands. Your stomach and groin area, avoiding the genitals. Your right leg and foot. Your left leg and foot. The back of your neck, your back, and your buttocks. Do not rinse. Discard the cloth and let the area air-dry. Do not put any substances on your body afterward--such as powder, lotion, or perfume--unless you are told to do so by your health care provider. Only use lotions that are recommended by the manufacturer. Put on clean clothes or pajamas. Contact a health care provider if: Your skin gets irritated after scrubbing. You have questions about using your solution or cloth. You swallow any chlorhexidine. Call your local poison control center ([phone] in the [place] Get help right away if: Your eyes itch badly, or they become very red or swollen. Your skin itches badly and is red or swollen. Your hearing changes. You have trouble seeing. You have swelling or tingling in your mouth or throat. You have trouble breathing. These symptoms may represent a serious problem that is an emergency. Do not wait to see if the symptoms will go away. Get medical help right away. Call your local emergency services (911 in the [place] Do not drive yourself to the hospital. Summary Chlorhexidine gluconate (CHG) is a germ-killing (antiseptic) solution that is used to clean the skin. Cleaning your skin with CHG may help to lower your risk for infection. You may be given CHG to use for bathing. It may be in a bottle or in a prepackaged cloth to use on your skin. Carefully follow your health care provider's instructions and the instructions on the product label. Do not use CHG if you have a chlorhexidine  allergy. Contact your health care provider if your skin gets irritated after scrubbing. This information is not intended to replace advice given to you by your health care provider. Make sure you discuss any questions you have with your health care provider. Document Revised: 06/01/2021 Document  Reviewed: 04/14/2020 Elsevier Patient Education  [place].

## 2022-02-24 ENCOUNTER — Encounter (HOSPITAL_COMMUNITY): Payer: Self-pay

## 2022-02-24 ENCOUNTER — Other Ambulatory Visit (INDEPENDENT_AMBULATORY_CARE_PROVIDER_SITE_OTHER): Payer: No Typology Code available for payment source | Admitting: General Surgery

## 2022-02-24 ENCOUNTER — Encounter (HOSPITAL_COMMUNITY)
Admission: RE | Admit: 2022-02-24 | Discharge: 2022-02-24 | Disposition: A | Discharge status: Home / Self Care | Payer: No Typology Code available for payment source | Source: Ambulatory Visit | Attending: General Surgery | Admitting: General Surgery

## 2022-02-24 DIAGNOSIS — R638 Other symptoms and signs concerning food and fluid intake: Secondary | ICD-10-CM

## 2022-02-24 DIAGNOSIS — Z01818 Encounter for other preprocedural examination: Secondary | ICD-10-CM | POA: Insufficient documentation

## 2022-02-24 DIAGNOSIS — K429 Umbilical hernia without obstruction or gangrene: Secondary | ICD-10-CM

## 2022-02-24 HISTORY — DX: Depression, unspecified: F32.A

## 2022-02-24 HISTORY — DX: Sleep apnea, unspecified: G47.30

## 2022-02-24 HISTORY — DX: Gastro-esophageal reflux disease without esophagitis: K21.9

## 2022-02-24 HISTORY — DX: Anxiety disorder, unspecified: F41.9

## 2022-02-24 LAB — TYPE AND SCREEN
ABO/RH(D): O NEG
Antibody Screen: NEGATIVE

## 2022-02-24 MED ORDER — OXYCODONE-ACETAMINOPHEN 5-325 MG PO TABS: 1.0000 | ORAL_TABLET | ORAL | 0 refills | Status: DC | PRN

## 2022-02-24 NOTE — Progress Notes (Signed)
Order percocet

## 2022-02-25 ENCOUNTER — Other Ambulatory Visit: Payer: Self-pay

## 2022-02-25 ENCOUNTER — Ambulatory Visit (HOSPITAL_BASED_OUTPATIENT_CLINIC_OR_DEPARTMENT_OTHER): Payer: No Typology Code available for payment source | Admitting: Anesthesiology

## 2022-02-25 ENCOUNTER — Ambulatory Visit (HOSPITAL_COMMUNITY)
Admission: RE | Admit: 2022-02-25 | Discharge: 2022-02-25 | Disposition: A | Discharge status: Home / Self Care | Payer: No Typology Code available for payment source | Attending: General Surgery | Admitting: General Surgery

## 2022-02-25 ENCOUNTER — Ambulatory Visit (HOSPITAL_COMMUNITY): Payer: No Typology Code available for payment source | Admitting: Anesthesiology

## 2022-02-25 ENCOUNTER — Encounter (HOSPITAL_COMMUNITY)
Admission: RE | Disposition: A | Discharge status: Home / Self Care | Payer: Self-pay | Source: Home / Self Care | Attending: General Surgery

## 2022-02-25 ENCOUNTER — Telehealth: Payer: Self-pay

## 2022-02-25 ENCOUNTER — Telehealth: Payer: Self-pay | Admitting: *Deleted

## 2022-02-25 ENCOUNTER — Encounter (HOSPITAL_COMMUNITY): Payer: Self-pay | Admitting: General Surgery

## 2022-02-25 DIAGNOSIS — G35 Multiple sclerosis: Secondary | ICD-10-CM | POA: Insufficient documentation

## 2022-02-25 DIAGNOSIS — G4730 Sleep apnea, unspecified: Secondary | ICD-10-CM

## 2022-02-25 DIAGNOSIS — K429 Umbilical hernia without obstruction or gangrene: Secondary | ICD-10-CM

## 2022-02-25 DIAGNOSIS — J45909 Unspecified asthma, uncomplicated: Secondary | ICD-10-CM

## 2022-02-25 DIAGNOSIS — K219 Gastro-esophageal reflux disease without esophagitis: Secondary | ICD-10-CM | POA: Insufficient documentation

## 2022-02-25 DIAGNOSIS — F17220 Nicotine dependence, chewing tobacco, uncomplicated: Secondary | ICD-10-CM | POA: Insufficient documentation

## 2022-02-25 DIAGNOSIS — F418 Other specified anxiety disorders: Secondary | ICD-10-CM | POA: Diagnosis not present

## 2022-02-25 DIAGNOSIS — F419 Anxiety disorder, unspecified: Secondary | ICD-10-CM | POA: Insufficient documentation

## 2022-02-25 DIAGNOSIS — R638 Other symptoms and signs concerning food and fluid intake: Secondary | ICD-10-CM

## 2022-02-25 DIAGNOSIS — F17290 Nicotine dependence, other tobacco product, uncomplicated: Secondary | ICD-10-CM

## 2022-02-25 DIAGNOSIS — Z01818 Encounter for other preprocedural examination: Secondary | ICD-10-CM

## 2022-02-25 SURGERY — REPAIR, HERNIA, UMBILICAL, ROBOT-ASSISTED
Anesthesia: General | Site: Abdomen

## 2022-02-25 MED ORDER — ALBUTEROL SULFATE HFA 108 (90 BASE) MCG/ACT IN AERS
INHALATION_SPRAY | RESPIRATORY_TRACT | Status: AC
  Filled 2022-02-25: qty 6.7

## 2022-02-25 MED ORDER — DEXAMETHASONE SODIUM PHOSPHATE 10 MG/ML IJ SOLN
INTRAMUSCULAR | Status: DC | PRN
  Administered 2022-02-25: 10 mg via INTRAVENOUS

## 2022-02-25 MED ORDER — ONDANSETRON HCL 4 MG/2ML IJ SOLN: 4.0000 mg | Freq: Once | INTRAMUSCULAR | Status: DC | PRN

## 2022-02-25 MED ORDER — IPRATROPIUM-ALBUTEROL 0.5-2.5 (3) MG/3ML IN SOLN
3.0000 mL | Freq: Once | RESPIRATORY_TRACT | Status: AC
  Administered 2022-02-25: 3 mL via RESPIRATORY_TRACT

## 2022-02-25 MED ORDER — DEXMEDETOMIDINE HCL IN NACL 80 MCG/20ML IV SOLN
INTRAVENOUS | Status: DC | PRN
  Administered 2022-02-25: 8 ug via BUCCAL

## 2022-02-25 MED ORDER — VANCOMYCIN HCL 1500 MG/300ML IV SOLN
1500.0000 mg | INTRAVENOUS | Status: AC
  Administered 2022-02-25: 1500 mg via INTRAVENOUS
  Filled 2022-02-25: qty 300

## 2022-02-25 MED ORDER — STERILE WATER FOR IRRIGATION IR SOLN
Status: DC | PRN
  Administered 2022-02-25: 500 mL

## 2022-02-25 MED ORDER — LACTATED RINGERS IV SOLN: INTRAVENOUS | Status: DC

## 2022-02-25 MED ORDER — BUPIVACAINE LIPOSOME 1.3 % IJ SUSP
INTRAMUSCULAR | Status: AC
  Filled 2022-02-25: qty 20

## 2022-02-25 MED ORDER — ONDANSETRON HCL 4 MG/2ML IJ SOLN
INTRAMUSCULAR | Status: DC | PRN
  Administered 2022-02-25: 4 mg via INTRAVENOUS

## 2022-02-25 MED ORDER — ALBUTEROL SULFATE HFA 108 (90 BASE) MCG/ACT IN AERS
INHALATION_SPRAY | RESPIRATORY_TRACT | Status: DC | PRN
  Administered 2022-02-25: 2 via RESPIRATORY_TRACT

## 2022-02-25 MED ORDER — KETOROLAC TROMETHAMINE 30 MG/ML IJ SOLN
INTRAMUSCULAR | Status: DC | PRN
  Administered 2022-02-25: 30 mg via INTRAVENOUS

## 2022-02-25 MED ORDER — CHLORHEXIDINE GLUCONATE 0.12 % MT SOLN
OROMUCOSAL | Status: AC
  Administered 2022-02-25: 15 mL via OROMUCOSAL
  Filled 2022-02-25: qty 15

## 2022-02-25 MED ORDER — FENTANYL CITRATE PF 50 MCG/ML IJ SOSY
25.0000 ug | PREFILLED_SYRINGE | INTRAMUSCULAR | Status: DC | PRN
  Administered 2022-02-25 (×2): 50 ug via INTRAVENOUS
  Filled 2022-02-25: qty 1

## 2022-02-25 MED ORDER — BUPIVACAINE LIPOSOME 1.3 % IJ SUSP
INTRAMUSCULAR | Status: DC | PRN
  Administered 2022-02-25: 20 mL

## 2022-02-25 MED ORDER — DIPHENHYDRAMINE HCL 25 MG PO CAPS
ORAL_CAPSULE | ORAL | Status: AC
  Filled 2022-02-25: qty 2

## 2022-02-25 MED ORDER — CHLORHEXIDINE GLUCONATE CLOTH 2 % EX PADS: 6.0000 | MEDICATED_PAD | Freq: Once | CUTANEOUS | Status: DC

## 2022-02-25 MED ORDER — ORAL CARE MOUTH RINSE: 15.0000 mL | Freq: Once | OROMUCOSAL | Status: AC

## 2022-02-25 MED ORDER — FENTANYL CITRATE PF 50 MCG/ML IJ SOSY
PREFILLED_SYRINGE | INTRAMUSCULAR | Status: AC
  Filled 2022-02-25: qty 1

## 2022-02-25 MED ORDER — HYDROCODONE-ACETAMINOPHEN 7.5-325 MG PO TABS
1.0000 | ORAL_TABLET | Freq: Once | ORAL | Status: AC | PRN
  Administered 2022-02-25: 1 via ORAL
  Filled 2022-02-25: qty 1

## 2022-02-25 MED ORDER — ROCURONIUM BROMIDE 10 MG/ML (PF) SYRINGE
PREFILLED_SYRINGE | INTRAVENOUS | Status: AC
  Filled 2022-02-25: qty 10

## 2022-02-25 MED ORDER — FENTANYL CITRATE (PF) 250 MCG/5ML IJ SOLN
INTRAMUSCULAR | Status: AC
  Filled 2022-02-25: qty 5

## 2022-02-25 MED ORDER — LIDOCAINE HCL (CARDIAC) PF 100 MG/5ML IV SOSY
PREFILLED_SYRINGE | INTRAVENOUS | Status: DC | PRN
  Administered 2022-02-25: 100 mg via INTRATRACHEAL

## 2022-02-25 MED ORDER — CHLORHEXIDINE GLUCONATE 0.12 % MT SOLN: 15.0000 mL | Freq: Once | OROMUCOSAL | Status: AC

## 2022-02-25 MED ORDER — PROPOFOL 10 MG/ML IV BOLUS
INTRAVENOUS | Status: DC | PRN
  Administered 2022-02-25: 200 mg via INTRAVENOUS

## 2022-02-25 MED ORDER — KETOROLAC TROMETHAMINE 30 MG/ML IJ SOLN
INTRAMUSCULAR | Status: AC
  Filled 2022-02-25: qty 1

## 2022-02-25 MED ORDER — FENTANYL CITRATE (PF) 100 MCG/2ML IJ SOLN
INTRAMUSCULAR | Status: DC | PRN
  Administered 2022-02-25: 100 ug via INTRAVENOUS
  Administered 2022-02-25 (×3): 50 ug via INTRAVENOUS

## 2022-02-25 MED ORDER — DIPHENHYDRAMINE HCL 25 MG PO CAPS
50.0000 mg | ORAL_CAPSULE | Freq: Once | ORAL | Status: AC
  Administered 2022-02-25: 50 mg via ORAL
  Filled 2022-02-25: qty 2

## 2022-02-25 MED ORDER — IPRATROPIUM-ALBUTEROL 0.5-2.5 (3) MG/3ML IN SOLN
RESPIRATORY_TRACT | Status: AC
  Filled 2022-02-25: qty 3

## 2022-02-25 MED ORDER — MIDAZOLAM HCL 2 MG/2ML IJ SOLN
INTRAMUSCULAR | Status: AC
  Filled 2022-02-25: qty 2

## 2022-02-25 MED ORDER — MIDAZOLAM HCL 5 MG/5ML IJ SOLN
INTRAMUSCULAR | Status: DC | PRN
  Administered 2022-02-25: 2 mg via INTRAVENOUS

## 2022-02-25 MED ORDER — SUGAMMADEX SODIUM 200 MG/2ML IV SOLN
INTRAVENOUS | Status: DC | PRN
  Administered 2022-02-25: 200 mg via INTRAVENOUS

## 2022-02-25 MED ORDER — ROCURONIUM BROMIDE 100 MG/10ML IV SOLN
INTRAVENOUS | Status: DC | PRN
  Administered 2022-02-25: 100 mg via INTRAVENOUS

## 2022-02-25 SURGICAL SUPPLY — 44 items
ADH SKN CLS APL DERMABOND .7 (GAUZE/BANDAGES/DRESSINGS) ×1
APL PRP STRL LF DISP 70% ISPRP (MISCELLANEOUS) ×1
CHLORAPREP W/TINT 26 (MISCELLANEOUS) ×1 IMPLANT
COVER MAYO STAND XLG (MISCELLANEOUS) IMPLANT
COVER TIP SHEARS 8 DVNC (MISCELLANEOUS) ×1 IMPLANT
COVER TIP SHEARS 8MM DA VINCI (MISCELLANEOUS) ×1
DEFOGGER SCOPE WARMER CLEARIFY (MISCELLANEOUS) IMPLANT
DERMABOND ADVANCED .7 DNX12 (GAUZE/BANDAGES/DRESSINGS) ×1 IMPLANT
DRAPE ARM DVNC X/XI (DISPOSABLE) ×3 IMPLANT
DRAPE COLUMN DVNC XI (DISPOSABLE) ×1 IMPLANT
DRAPE DA VINCI XI ARM (DISPOSABLE) ×3
DRAPE DA VINCI XI COLUMN (DISPOSABLE) ×1
DRSG TEGADERM 2-3/8X2-3/4 SM (GAUZE/BANDAGES/DRESSINGS) IMPLANT
GLOVE BIOGEL PI IND STRL 6 (GLOVE) IMPLANT
GLOVE BIOGEL PI IND STRL 6.5 (GLOVE) IMPLANT
GLOVE BIOGEL PI IND STRL 7.0 (GLOVE) ×3 IMPLANT
GLOVE SURG SS PI 7.5 STRL IVOR (GLOVE) ×2 IMPLANT
GOWN STRL REUS W/TWL LRG LVL3 (GOWN DISPOSABLE) ×3 IMPLANT
MANIFOLD NEPTUNE II (INSTRUMENTS) ×1 IMPLANT
MESH VENTRALIGHT ST 4X6IN (Mesh General) IMPLANT
NDL HYPO 21X1.5 SAFETY (NEEDLE) ×1 IMPLANT
NDL INSUFFLATION 14GA 120MM (NEEDLE) ×1 IMPLANT
NEEDLE HYPO 21X1.5 SAFETY (NEEDLE) ×1 IMPLANT
NEEDLE INSUFFLATION 14GA 120MM (NEEDLE) ×1 IMPLANT
OBTURATOR OPTICAL STANDARD 8MM (TROCAR) ×1
OBTURATOR OPTICAL STND 8 DVNC (TROCAR) ×1
OBTURATOR OPTICALSTD 8 DVNC (TROCAR) ×1 IMPLANT
PACK LAP CHOLE LZT030E (CUSTOM PROCEDURE TRAY) IMPLANT
PENCIL SMOKE EVACUATOR (MISCELLANEOUS) IMPLANT
SEAL CANN UNIV 5-8 DVNC XI (MISCELLANEOUS) ×3 IMPLANT
SEAL XI 5MM-8MM UNIVERSAL (MISCELLANEOUS) ×3
SET TUBE SMOKE EVAC HIGH FLOW (TUBING) ×1 IMPLANT
SPONGE GAUZE 2X2 8PLY STRL LF (GAUZE/BANDAGES/DRESSINGS) IMPLANT
SUT MNCRL AB 4-0 PS2 18 (SUTURE) ×2 IMPLANT
SUT STRATAFIX 0 PDS+ CT-2 23 (SUTURE) ×1
SUT V-LOC 90 ABS 3-0 VLT  V-20 (SUTURE) ×5
SUT V-LOC 90 ABS 3-0 VLT V-20 (SUTURE) ×3 IMPLANT
SUTURE STRATFX 0 PDS+ CT-2 23 (SUTURE) ×1 IMPLANT
SYR 20ML LL LF (SYRINGE) ×1 IMPLANT
SYS RETRIEVAL 5MM INZII UNIV (BASKET) ×1
SYSTEM RETRIEVL 5MM INZII UNIV (BASKET) IMPLANT
TRAY FOLEY W/BAG SLVR 16FR (SET/KITS/TRAYS/PACK) ×1
TRAY FOLEY W/BAG SLVR 16FR ST (SET/KITS/TRAYS/PACK) ×1 IMPLANT
WATER STERILE IRR 500ML POUR (IV SOLUTION) ×1 IMPLANT

## 2022-02-25 NOTE — Telephone Encounter (Signed)
Called patient and advised [facility] will not ship Xolair to office and will to patient but need documentation on admin training of [place]. Appt made for patient to come in 1/17 for samples

## 2022-02-25 NOTE — Interval H&P Note (Signed)
History and Physical Interval Note:  02/25/2022 11:09 AM  Troy Lyons  has presented today for surgery, with the diagnosis of UMBILICAL HERNIA 3.5 CM.  The various methods of treatment have been discussed with the patient and family. After consideration of risks, benefits and other options for treatment, the patient has consented to  Procedure(s): XI [place] (N/A) as a surgical intervention.  The patient's history has been reviewed, patient examined, no change in status, stable for surgery.  I have reviewed the patient's chart and labs.  Questions were answered to the patient's satisfaction.     Aviva Signs

## 2022-02-25 NOTE — Telephone Encounter (Signed)
Patient's spouse, Benjamine Mola called in - DOB/DPR/Pharmacy verified - patient was on phone as well - stated he's having an Urticaria breakout that started 6 am this morning - areas: arms, hands and face.    Patient/Wife are requesting a medication refill on the following medications: Hydroxyzine 25 mg and Albuterol (Proventil) neb solution.  Patient/Wife advised message would be forwarded to provider - our office will contact them once the provider has responded.  Called patient/wife back - advised Hydroxyzine has not been filled since 08/16/2019 and our office has never prescribed him Albuterol (Proventil) last prescribed in 2017 by a Dr. Milton Millward.  Patient/Wife verbalized understanding, no further questions.

## 2022-02-25 NOTE — Transfer of Care (Signed)
Immediate Anesthesia Transfer of Care Note  Patient: Troy Lyons  Procedure(s) Performed: XI ROBOT ASSISTED UMBILICAL HERNIA REPAIR W/ MESH (Abdomen)  Patient Location: PACU  Anesthesia Type:General  Level of Consciousness: awake, alert , and oriented  Airway & Oxygen Therapy: Patient Spontanous Breathing and Patient connected to face mask oxygen  Post-op Assessment: Report given to RN and Post -op Vital signs reviewed and stable  Post vital signs: Reviewed and stable  Last Vitals:  Vitals Value Taken Time  BP 146/94 02/25/22 1418  Temp    Pulse 87 02/25/22 1419  Resp 18 02/25/22 1419  SpO2 98 % 02/25/22 1419  Vitals shown include unvalidated device data.  Last Pain:  Vitals:   02/25/22 1116  TempSrc: Oral  PainSc: 6       Patients Stated Pain Goal: 8 (35/46/56 [id number])  Complications: No notable events documented.

## 2022-02-25 NOTE — Anesthesia Procedure Notes (Signed)
Procedure Name: Intubation Date/Time: 02/25/2022 11:56 AM  Performed by: Jeral Pinch, RNPre-anesthesia Checklist: Patient identified, Emergency Drugs available, Suction available and Patient being monitored Patient Re-evaluated:Patient Re-evaluated prior to induction Oxygen Delivery Method: Circle system utilized Preoxygenation: Pre-oxygenation with 100% oxygen Induction Type: IV induction Ventilation: Mask ventilation without difficulty Laryngoscope Size: Mac Grade View: Grade I Tube type: Oral Tube size: 7.5 mm Number of attempts: 1 Airway Equipment and Method: Stylet and Oral airway Placement Confirmation: ETT inserted through vocal cords under direct vision, positive ETCO2 and breath sounds checked- equal and bilateral Secured at: 24 cm Tube secured with: Tape Dental Injury: Teeth and Oropharynx as per pre-operative assessment

## 2022-02-25 NOTE — Op Note (Signed)
Patient:  Troy Lyons  DOB:  1980/07/25  MRN:  [id number]   Preop Diagnosis: Umbilical hernia  Postop Diagnosis: Same  Procedure: Robot assisted laparoscopic umbilical herniorrhaphy with mesh  Surgeon: Aviva Signs, MD  Anes: General endotracheal  Indications: Patient is a 42 year old white male who presents with a symptomatic umbilical hernia.  The risks and benefits of the procedure including bleeding, infection, mesh use, and the possibility of recurrence of the hernia were fully explained to the patient, who gave informed consent.  Procedure note: The patient was placed in the supine position.  After induction of general endotracheal anesthesia, the abdomen was prepped and draped using the usual sterile technique with ChloraPrep.  Surgical site confirmation was performed.  A Veress needle was introduced into the left upper quadrant.  Confirmation of placement was done using the saline drop test.  The abdomen was then insufflated 15 mmHg pressure.  An 8 mm trocar was introduced into the abdominal cavity under direct visualization without difficulty.  An additional 8 mm trocar was placed in the left flank region and another 1 placed in the left lower quadrant region.  The hernia was identified and targeted.  The robot was then docked and we proceeded with the procedure.  The patient had some omentum stuck up into the hernia sac at the umbilical level.  It appeared to be approximately 4-1/2 cm in its greatest diameter.  The hernia sac was reduced.  I then closed the defect longitudinally using an oh strata fix running suture.  A 10 x 15 cm Bard Ventralight DualMesh was then inserted and secured circumferentially to the anterior abdominal wall using a 3 oh V-Loc running suture.  No abnormal bleeding was noted at the end of the procedure.  All air was then evacuated from the abdominal cavity prior to the removal of the trocars.  The robot was undocked.  All incisions were injected with  Exparel.  All incisions were closed using a 4-0 Monocryl subcuticular suture.  Dermabond was applied.  All tape and needle counts were correct at the end of the procedure.  The patient was extubated in the operating room and transferred to PACU in stable condition.  Complications: None  EBL: Minimal  Specimen: None

## 2022-02-25 NOTE — Anesthesia Preprocedure Evaluation (Signed)
Anesthesia Evaluation  Patient identified by MRN, date of birth, ID band Patient awake    Reviewed: NPO status , Patient's Chart, lab work & pertinent test results, reviewed documented beta blocker date and time   Airway Mallampati: II  TM Distance: >3 FB Neck ROM: Full    Dental no notable dental hx.    Pulmonary asthma , sleep apnea    Pulmonary exam normal        Cardiovascular Exercise Tolerance: Good negative cardio ROS Normal cardiovascular exam     Neuro/Psych  PSYCHIATRIC DISORDERS Anxiety Depression    Multiple sclerosis    GI/Hepatic Neg liver ROS,GERD  ,,  Endo/Other  negative endocrine ROS    Renal/GU negative Renal ROS  negative genitourinary   Musculoskeletal negative musculoskeletal ROS (+)  Inguinal hernia   Abdominal Normal abdominal exam  (+)   Peds  Hematology   Anesthesia Other Findings   Reproductive/Obstetrics                             Anesthesia Physical Anesthesia Plan  ASA: 3  Anesthesia Plan: General   Post-op Pain Management:    Induction: Intravenous  PONV Risk Score and Plan: 2  Airway Management Planned: Oral ETT  Additional Equipment:   Intra-op Plan:   Post-operative Plan: Extubation in OR  Informed Consent: I have reviewed the patients History and Physical, chart, labs and discussed the procedure including the risks, benefits and alternatives for the proposed anesthesia with the patient or authorized representative who has indicated his/her understanding and acceptance.       Plan Discussed with: CRNA  Anesthesia Plan Comments:        Anesthesia Quick Evaluation

## 2022-02-26 ENCOUNTER — Other Ambulatory Visit: Payer: Self-pay | Admitting: *Deleted

## 2022-02-26 DIAGNOSIS — Z01818 Encounter for other preprocedural examination: Secondary | ICD-10-CM

## 2022-02-26 DIAGNOSIS — R638 Other symptoms and signs concerning food and fluid intake: Secondary | ICD-10-CM

## 2022-02-26 MED ORDER — ALBUTEROL SULFATE (2.5 MG/3ML) 0.083% IN NEBU: 2.5000 mg | INHALATION_SOLUTION | RESPIRATORY_TRACT | 1 refills | Status: DC | PRN

## 2022-02-26 MED ORDER — HYDROXYZINE HCL 25 MG PO TABS: 25.0000 mg | ORAL_TABLET | Freq: Every day | ORAL | 5 refills | Status: DC

## 2022-02-26 NOTE — Telephone Encounter (Signed)
Medications have been sent to pharmacy. Called patient and advised of Anne's plan. Patient verbalized understanding. Tammy is still working on the shipment process of his Xolair with the [place]. He has been scheduled to come in next week and receive Xolair samples.

## 2022-02-26 NOTE — Telephone Encounter (Signed)
Can you please call in albuterol for nebulizer and hydroxyzine 25 mg -take 1 tablet at bedtime. Has he been able to start Xolair injections yet? Please have him continue the histamine ladder and montelukast. Please have them call back if the hives do not resolve. Thank you

## 2022-02-26 NOTE — Telephone Encounter (Signed)
Thank you :)

## 2022-02-26 NOTE — Anesthesia Postprocedure Evaluation (Signed)
Anesthesia Post Note  Patient: Troy Lyons  Procedure(s) Performed: XI ROBOT ASSISTED UMBILICAL HERNIA REPAIR W/ MESH (Abdomen)  Patient location during evaluation: PACU Anesthesia Type: General Level of consciousness: awake and alert Pain management: pain level controlled Vital Signs Assessment: post-procedure vital signs reviewed and stable Respiratory status: spontaneous breathing, nonlabored ventilation, respiratory function stable and patient connected to nasal cannula oxygen Cardiovascular status: blood pressure returned to baseline and stable Postop Assessment: no apparent nausea or vomiting Anesthetic complications: no   There were no known notable events for this encounter.   Last Vitals:  Vitals:   02/25/22 1530 02/25/22 1609  BP: (!) 143/76 (!) 139/90  Pulse: 96 99  Resp: 14 18  Temp:  36.9 C  SpO2: 93% 94%    Last Pain:  Vitals:   02/25/22 1609  TempSrc: Oral  PainSc: 9                  Trixie Rude

## 2022-03-02 ENCOUNTER — Telehealth: Payer: Self-pay | Admitting: Family Medicine

## 2022-03-02 ENCOUNTER — Other Ambulatory Visit (INDEPENDENT_AMBULATORY_CARE_PROVIDER_SITE_OTHER): Payer: No Typology Code available for payment source | Admitting: General Surgery

## 2022-03-02 ENCOUNTER — Encounter: Payer: No Typology Code available for payment source | Admitting: General Surgery

## 2022-03-02 ENCOUNTER — Other Ambulatory Visit: Payer: Self-pay | Admitting: Family Medicine

## 2022-03-02 DIAGNOSIS — Z09 Encounter for follow-up examination after completed treatment for conditions other than malignant neoplasm: Secondary | ICD-10-CM

## 2022-03-02 DIAGNOSIS — K429 Umbilical hernia without obstruction or gangrene: Secondary | ICD-10-CM

## 2022-03-02 MED ORDER — OXYCODONE-ACETAMINOPHEN 5-325 MG PO TABS: 1.0000 | ORAL_TABLET | ORAL | 0 refills | Status: DC | PRN

## 2022-03-02 NOTE — Telephone Encounter (Signed)
FMLA paperwork completed and given to patient today. Out of work from 02/23/2022 until 03/12/2022 at which time he will be on light duty - No heavy lifting > 10 lbs, excessive bending, pushing, pulling, or squatting x 6 weeks and then he may work unrestricted after 04/23/22. Note written and given to patient in office.

## 2022-03-02 NOTE — Progress Notes (Signed)
Reordered pain medication for recent surgery.

## 2022-03-03 ENCOUNTER — Ambulatory Visit: Payer: No Typology Code available for payment source

## 2022-03-03 DIAGNOSIS — L501 Idiopathic urticaria: Secondary | ICD-10-CM

## 2022-03-03 MED ORDER — OMALIZUMAB 150 MG/ML ~~LOC~~ SOSY
150.0000 mg | PREFILLED_SYRINGE | SUBCUTANEOUS | Status: AC
  Administered 2022-03-03: 150 mg via SUBCUTANEOUS

## 2022-03-03 NOTE — Progress Notes (Signed)
Immunotherapy   Patient Details  Name: GEOVANNIE VILAR MRN: [id number] Date of Birth: 1980-03-05  03/03/2022  Sheryle Hail started injections for  Xolair home administration. Following schedule: Every twenty eight days.  Frequency:Every four weeks.  Epi-Pen:Epi-Pen Available  Consent signed  in office today and patient instructions given on how to administer in the abdomen and the thigh. Patient expressed that he used to administer Dupixent at home to himself prior to the [place]. Patient injected him self in the left thigh after I demonstrated in the right thigh. Patient waited in his car for thirty minutes without an issue before coming in to be checked and departing the facility.     Julius Bowels 03/03/2022, 3:43 PM

## 2022-03-04 ENCOUNTER — Other Ambulatory Visit: Payer: Self-pay | Admitting: *Deleted

## 2022-03-04 MED ORDER — OMALIZUMAB 150 MG/ML ~~LOC~~ SOSY: 300.0000 mg | PREFILLED_SYRINGE | SUBCUTANEOUS | 11 refills | Status: DC

## 2022-03-22 ENCOUNTER — Other Ambulatory Visit: Payer: Self-pay | Admitting: Family Medicine

## 2022-04-19 ENCOUNTER — Telehealth: Payer: Self-pay | Admitting: *Deleted

## 2022-04-19 NOTE — Telephone Encounter (Signed)
Surgical Date: 02/25/2022 Procedure: XI Robotic Assisted Umbilical Hernia Repair W/ Mesh  Received call from patient [phone]~ telephone.   Patient reports that he has been on light duty since returning to work on 03/12/2022. States that his light duty is to end on 04/23/2022.   Reports that he has healed well and feels that he is ready for full duty at work. States that he has returned to the gym and is running. Patient is employed for the [employer] as a Engineer, structural.   Requested to have note to return to full duty on 04/20/2022. Letter transcribed.

## 2022-07-02 ENCOUNTER — Emergency Department (HOSPITAL_COMMUNITY): Payer: No Typology Code available for payment source

## 2022-07-02 ENCOUNTER — Emergency Department (HOSPITAL_COMMUNITY)
Admission: EM | Admit: 2022-07-02 | Discharge: 2022-07-02 | Disposition: A | Discharge status: Home / Self Care | Payer: No Typology Code available for payment source | Attending: Emergency Medicine | Admitting: Emergency Medicine

## 2022-07-02 ENCOUNTER — Encounter (HOSPITAL_COMMUNITY): Payer: Self-pay

## 2022-07-02 DIAGNOSIS — R091 Pleurisy: Secondary | ICD-10-CM | POA: Diagnosis not present

## 2022-07-02 DIAGNOSIS — F17200 Nicotine dependence, unspecified, uncomplicated: Secondary | ICD-10-CM | POA: Diagnosis not present

## 2022-07-02 DIAGNOSIS — I10 Essential (primary) hypertension: Secondary | ICD-10-CM | POA: Diagnosis not present

## 2022-07-02 DIAGNOSIS — R079 Chest pain, unspecified: Secondary | ICD-10-CM

## 2022-07-02 LAB — BASIC METABOLIC PANEL
Anion gap: 7 (ref 5–15)
BUN: 16 mg/dL (ref 6–20)
CO2: 22 mmol/L (ref 22–32)
Calcium: 8.9 mg/dL (ref 8.9–10.3)
Chloride: 108 mmol/L (ref 98–111)
Creatinine, Ser: 0.91 mg/dL (ref 0.61–1.24)
GFR, Estimated: >60 mL/min (ref >60)
Glucose, Bld: 100 mg/dL — ABNORMAL HIGH (ref 70–99)
Potassium: 4 mmol/L (ref 3.5–5.1)
Sodium: 137 mmol/L (ref 135–145)

## 2022-07-02 LAB — CBC
HCT: 44.8 % (ref 39.0–52.0)
Hemoglobin: 15.9 g/dL (ref 13.0–17.0)
MCH: 31.9 pg (ref 26.0–34.0)
MCHC: 35.5 g/dL (ref 30.0–36.0)
MCV: 89.8 fL (ref 80.0–100.0)
Platelets: 159 10*3/uL (ref 150–400)
RBC: 4.99 MIL/uL (ref 4.22–5.81)
RDW: 12.4 % (ref 11.5–15.5)
WBC: 6.6 10*3/uL (ref 4.0–10.5)
nRBC: 0.3 % — ABNORMAL HIGH (ref 0.0–0.2)

## 2022-07-02 LAB — TROPONIN I (HIGH SENSITIVITY)
Troponin I (High Sensitivity): 2 ng/L (ref <18)
Troponin I (High Sensitivity): 3 ng/L (ref <18)

## 2022-07-02 NOTE — ED Triage Notes (Signed)
Pt comes in with Chest pain that started 2 days ago at work with SOB went away. Today chest pain and SOB started an hour ago and having left side arm pain that shoots up into the jaw. Patient states left arm still hurts but pain to jaw is gone.

## 2022-07-02 NOTE — ED Provider Notes (Signed)
[place] EMERGENCY DEPARTMENT AT [facility] Provider Note   CSN: [id number] Arrival date & time: 07/02/22  1247     History  Chief Complaint  Patient presents with   Chest Pain    Troy Lyons is a 42 y.o. male with past medical history significant for hyperlipidemia, MS without history of hypertension, tobacco abuse, previous ACS, previous stroke who presents with concern for acute left-sided chest pain that felt sharp/lightening knee with some radiation into left arm, left jaw earlier today.  Patient reports that he had a similar sensation 2 days ago, today he was not exerting himself, but a couple of days ago he was.  She reports no shortness of breath, nausea, vomiting, he denies any recent fever, chills.  He reports that he is just getting over pneumonia.  Patient reports he is chest pain-free at time of my evaluation, still has some difference in sensation without true numbness around the shoulder.   Chest Pain      Home Medications Prior to Admission medications   Medication Sig Start Date End Date Taking? Authorizing Provider  albuterol (PROVENTIL) (2.5 MG/3ML) 0.083% nebulizer solution Take 3 mLs (2.5 mg total) by nebulization every 4 (four) hours as needed for wheezing or shortness of breath. 02/26/22   Hetty Blend, FNP  albuterol (VENTOLIN HFA) 108 (90 Base) MCG/ACT inhaler 4 puffs every 4-6 hours as needed for shortness of breath or wheezing. 08/16/19   Alfonse Spruce, MD  baclofen (LIORESAL) 10 MG tablet Take 10 mg by mouth 3 (three) times daily.    [provider]  cetirizine (ZYRTEC) 10 MG tablet Take 1 tablet (10 mg total) by mouth daily. 02/02/22   Hetty Blend, FNP  cholecalciferol (VITAMIN D) 1000 UNITS tablet Take 2,000 Units by mouth daily.    [provider]  EPINEPHrine 0.3 mg/0.3 mL IJ SOAJ injection Inject 0.3 mg into the muscle once as needed for anaphylaxis. 02/02/22   Hetty Blend, FNP  famotidine (PEPCID) 20 MG  tablet Take 1 tablet (20 mg total) by mouth 2 (two) times daily. Patient taking differently: Take 20 mg by mouth daily. 08/16/19   Alfonse Spruce, MD  Fluticasone Propionate Timmothy Sours) 93 MCG/ACT EXHU Place 2 Pump into the nose 2 (two) times daily. 02/02/22   Hetty Blend, FNP  fluticasone-salmeterol (ADVAIR HFA) 782-95 MCG/ACT inhaler Inhale 2 puffs into the lungs 2 (two) times daily. Patient not taking: Reported on 01/20/2022 01/24/20   Alfonse Spruce, MD  gabapentin (NEURONTIN) 600 MG tablet Take 600 mg by mouth 2 (two) times daily. 07/15/20   [provider]  hydrOXYzine (ATARAX) 25 MG tablet Take 1 tablet (25 mg total) by mouth at bedtime. 02/26/22   Hetty Blend, FNP  hydrOXYzine (ATARAX/VISTARIL) 25 MG tablet Take 1 tablet (25 mg total) by mouth every 8 (eight) hours as needed for itching. 08/16/19   Alfonse Spruce, MD  montelukast (SINGULAIR) 10 MG tablet Take 1 tablet (10 mg total) by mouth every morning. 04/08/20   Alfonse Spruce, MD  natalizumab (TYSABRI) 300 MG/15ML injection Inject 15 mLs into the vein every 6 (six) weeks.    [provider]  omalizumab Geoffry Paradise) 150 MG/ML prefilled syringe Inject 300 mg into the skin every 28 (twenty-eight) days. 03/04/22   Alfonse Spruce, MD  Omega-3 Fatty Acids (FISH OIL PO) Take 1 capsule by mouth daily.    [provider]  oxyCODONE-acetaminophen (PERCOCET/ROXICET) 5-325 MG tablet Take 1  tablet by mouth every 4 (four) hours as needed for severe pain. 03/02/22   Franky Macho, MD  rizatriptan (MAXALT-MLT) 10 MG disintegrating tablet Take 10 mg by mouth as needed for migraine. May repeat in 2 hours if needed    [provider]  SYMBICORT 160-4.5 MCG/ACT inhaler Inhale 2 puffs into the lungs in the morning and at bedtime. 08/16/19   Alfonse Spruce, MD  thiamine (VITAMIN B-1) 50 MG tablet Take 50 mg by mouth daily.    [provider]  topiramate (TOPAMAX) 50 MG tablet Take 50 mg by  mouth 2 (two) times daily.    [provider]  vitamin C (ASCORBIC ACID) 500 MG tablet Take 500 mg by mouth daily.    [provider]  zinc sulfate 50 MG CAPS capsule Take 220 mg by mouth daily. 12/25/18   [provider]      Allergies    Penicillins, Poison sumac extract, Carbamazepine, and Fingolimod    Review of Systems   Review of Systems  Cardiovascular:  Positive for chest pain.  All other systems reviewed and are negative.   Physical Exam Updated Vital Signs BP 108/83   Pulse (!) 57   Resp 16   Ht 6\' 3"  (1.905 m)   Wt 126.1 kg   SpO2 97%   BMI 34.75 kg/m  Physical Exam Vitals and nursing note reviewed.  Constitutional:      General: He is not in acute distress.    Appearance: Normal appearance.  HENT:     Head: Normocephalic and atraumatic.  Eyes:     General:        Right eye: No discharge.        Left eye: No discharge.  Cardiovascular:     Rate and Rhythm: Normal rate and regular rhythm.     Heart sounds: No murmur heard.    No friction rub. No gallop.  Pulmonary:     Effort: Pulmonary effort is normal.     Breath sounds: Normal breath sounds.  Chest:     Comments: No ttp of chest wall Abdominal:     General: Bowel sounds are normal.     Palpations: Abdomen is soft.  Skin:    General: Skin is warm and dry.     Capillary Refill: Capillary refill takes less than 2 seconds.  Neurological:     Mental Status: He is alert and oriented to person, place, and time.  Psychiatric:        Mood and Affect: Mood normal.        Behavior: Behavior normal.     ED Results / Procedures / Treatments   Labs (all labs ordered are listed, but only abnormal results are displayed) Labs Reviewed  BASIC METABOLIC PANEL - Abnormal; Notable for the following components:      Result Value   Glucose, Bld 100 (*)    All other components within normal limits  CBC - Abnormal; Notable for the following components:   nRBC 0.3 (*)    All other  components within normal limits  TROPONIN I (HIGH SENSITIVITY)  TROPONIN I (HIGH SENSITIVITY)    EKG None  Radiology DG Chest 2 View  Result Date: 07/02/2022 CLINICAL DATA:  Chest pain EXAM: CHEST - 2 VIEW COMPARISON:  01/12/2022 FINDINGS: The heart size and mediastinal contours are within normal limits. Both lungs are clear. The visualized skeletal structures are unremarkable. IMPRESSION: No active cardiopulmonary disease. Electronically Signed   By: Alan Ripper  Patel M.D.   On: 07/02/2022 14:53    Procedures Procedures    Medications Ordered in ED Medications - No data to display  ED Course/ Medical Decision Making/ A&P                             Medical Decision Making Amount and/or Complexity of Data Reviewed Labs: ordered. Radiology: ordered.   .This patient is a 42 y.o. male  who presents to the ED for concern of chest pain.   Differential diagnoses prior to evaluation: The emergent differential diagnosis includes, but is not limited to,  ACS, AAS, PE, Mallory-Weiss, Boerhaave's, Pneumonia, acute bronchitis, asthma or COPD exacerbation, anxiety, MSK pain or traumatic injury to the chest, acid reflux versus other . This is not an exhaustive differential.   Past Medical History / Co-morbidities: Obesity, HLD, MS, no previous history of hypertension, CAD, ACS, he does endorse a strong family history of ACS  Additional history: Chart reviewed. Pertinent results include: overall noncontributory, no previous stress test, echo  Physical Exam: Physical exam performed. The pertinent findings include: Patient with stable vital signs, no acute distress.  Very mild bradycardia, pulse rate around 57 bpm, but with normal rhythm.  Lab Tests/Imaging studies: I personally interpreted labs/imaging and the pertinent results include: CBC unremarkable, BMP unremarkable, troponin negative x 2, patient chest pain-free on reevaluation without any intervention.  I independently interpreted  plain, chest x-ray which shows no evidence of acute intrathoracic abnormality.  I agree with the radiologist interpretation.  Cardiac monitoring: EKG obtained and interpreted by my attending physician which shows: NSR   Disposition: After consideration of the diagnostic results and the patients response to treatment, I feel that overall I have a high clinical suspicion for pleurisy as patient recently had pneumonia, his chest pain is nonexertional, described as sharp.  However given his history of hyperlipidemia, strong family history of CAD in father I do think he would benefit from an ambulatory referral to cardiology for stress test, echo.   emergency department workup does not suggest an emergent condition requiring admission or immediate intervention beyond what has been performed at this time. The plan is: as above. The patient is safe for discharge and has been instructed to return immediately for worsening symptoms, change in symptoms or any other concerns.  Final Clinical Impression(s) / ED Diagnoses Final diagnoses:  Chest pain, unspecified type  Pleurisy    Rx / DC Orders ED Discharge Orders          Ordered    Ambulatory referral to Cardiology        07/02/22 1554              [place] 07/02/22 1606    Bethann Berkshire, MD 07/03/22 1851

## 2022-07-02 NOTE — Discharge Instructions (Signed)
Please use Tylenol or ibuprofen for pain.  You may use 600 mg ibuprofen every 6 hours or 1000 mg of Tylenol every 6 hours.  You may choose to alternate between the 2.  This would be most effective.  Not to exceed 4 g of Tylenol within 24 hours.  Not to exceed 3200 mg ibuprofen 24 hours.  

## 2023-01-07 ENCOUNTER — Telehealth: Payer: Self-pay

## 2023-01-07 NOTE — Telephone Encounter (Signed)
Patients auth expires 01-20-2023. I called to see if he wanted to be seen again & he scheduled a follow up visit for 01-28-2023 with Dr. Dellis Anes.  I have sent a request to the [facility] for an updated auth.

## 2023-01-19 NOTE — Telephone Encounter (Signed)
I called and left a voicemail for the Consult Nurse to get an update on patients authorization approval. Patient may need to be rescheduled.

## 2023-01-28 ENCOUNTER — Ambulatory Visit: Payer: No Typology Code available for payment source | Admitting: Allergy & Immunology

## 2023-02-02 ENCOUNTER — Ambulatory Visit (INDEPENDENT_AMBULATORY_CARE_PROVIDER_SITE_OTHER): Payer: No Typology Code available for payment source | Admitting: Allergy & Immunology

## 2023-02-02 ENCOUNTER — Encounter: Payer: Self-pay | Admitting: Allergy & Immunology

## 2023-02-02 VITALS — BP 116/72 | HR 101 | Temp 98.5°F | Resp 16 | Ht 73.23 in | Wt 288.0 lb

## 2023-02-02 DIAGNOSIS — Z91038 Other insect allergy status: Secondary | ICD-10-CM

## 2023-02-02 DIAGNOSIS — L501 Idiopathic urticaria: Secondary | ICD-10-CM

## 2023-02-02 DIAGNOSIS — J329 Chronic sinusitis, unspecified: Secondary | ICD-10-CM | POA: Diagnosis not present

## 2023-02-02 DIAGNOSIS — B999 Unspecified infectious disease: Secondary | ICD-10-CM | POA: Diagnosis not present

## 2023-02-02 DIAGNOSIS — J302 Other seasonal allergic rhinitis: Secondary | ICD-10-CM

## 2023-02-02 DIAGNOSIS — J4540 Moderate persistent asthma, uncomplicated: Secondary | ICD-10-CM

## 2023-02-02 DIAGNOSIS — J3089 Other allergic rhinitis: Secondary | ICD-10-CM

## 2023-02-02 NOTE — Progress Notes (Unsigned)
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   Subjective:   Troy Lyons is a 42 y.o. male presenting today for follow up of  Chief Complaint  Patient presents with   Follow-up    Troy Lyons has a history of the following: Patient Active Problem List   Diagnosis Date Noted   Umbilical hernia without obstruction and without gangrene 02/25/2022   Idiopathic urticaria 01/20/2022   Seasonal and perennial allergic rhinitis 07/18/2019   Moderate persistent asthma without complication 07/18/2019   Frequent sinus infections 05/16/2019   Nasal polyp 05/16/2019   Pruritic rash 05/16/2019   Penicillin allergy 05/16/2019   Asthma 05/16/2019   Seasonal and perennial allergic rhinoconjunctivitis 05/16/2019   Acute respiratory failure with hypoxia (HCC) 01/03/2019   Pneumonia due to COVID-19 virus 01/01/2019   MS (multiple sclerosis) (HCC) 01/01/2019    History obtained from: chart review and {Persons; PED relatives w/patient:19415::"patient"}.  Discussed the use of AI scribe software for clinical note transcription with the patient and/or guardian, who gave verbal consent to proceed.  Troy Lyons is a 42 y.o. male presenting for {Blank single:19197::"a food challenge","a drug challenge","skin testing","a sick visit","an evaluation of ***","a follow up visit"}.  He was last seen in December 2023.  At that time, he was continued on Symbicort 160 mcg 2 puffs twice daily  as well as albuterol as needed.  For his allergic rhinitis, he was started on cetirizine and Xhance.  We talked about an ENT referral.  For his hives, he was continued on suppressive doses of antihistamines.  He continue to avoid stinging insects.  We got labs to look for evidence of IgE to stinging insects.  This was negative to the entire panel.  Discussed the use of AI scribe software for clinical note transcription with the patient, who gave verbal consent to proceed.  History of Present Illness            {Blank single:19197::"Asthma/Respiratory Symptom History: ***"," "}  {Blank single:19197::"Allergic Rhinitis Symptom History: ***"," "}  {Blank single:19197::"Food Allergy Symptom History: ***"," "}  {Blank single:19197::"Skin Symptom History: ***"," "}  {Blank single:19197::"GERD Symptom History: ***"," "}  Otherwise, there have been no changes to his past medical history, surgical history, family history, or social history.    Review of systems otherwise negative other than that mentioned in the HPI.    Objective:   There were no vitals taken for this visit. There is no height or weight on file to calculate BMI.    Physical Exam   Diagnostic studies:    Spirometry: results normal (FEV1: 3.53/76%, FVC: 4.92/85%, FEV1/FVC: 72%).    Spirometry consistent with normal pattern. {Blank single:19197::"Albuterol/Atrovent nebulizer","Xopenex/Atrovent nebulizer","Albuterol nebulizer","Albuterol four puffs via MDI","Xopenex four puffs via MDI"} treatment given in clinic with {Blank single:19197::"significant improvement in FEV1 per ATS criteria","significant improvement in FVC per ATS criteria","significant improvement in FEV1 and FVC per ATS criteria","improvement in FEV1, but not significant per ATS criteria","improvement in FVC,  but not significant per ATS criteria","improvement in FEV1 and FVC, but not significant per ATS criteria","no improvement"}.  Allergy Studies:  {Blank single:19197::"none","deferred due to recent antihistamine use","deferred due to insurance stipulations that require a separate visit for testing","labs sent instead"," "}    {Blank single:19197::"Allergy testing results were read and interpreted by myself, documented by clinical staff."," "}      Malachi Bonds, MD  [facility]

## 2023-02-02 NOTE — Patient Instructions (Signed)
Asthma - Lung testing looks great,  - We are not going to make any changes at this time. - Daily controller medication(s): Symbicort 160/4.71mcg two puffs twice daily with spacer - Prior to physical activity: albuterol 2 puffs 10-15 minutes before physical activity. - Rescue medications: albuterol 4 puffs every 4-6 hours as needed - Asthma control goals:  * Full participation in all desired activities (may need albuterol before activity) * Albuterol use two time or less a week on average (not counting use with activity) * Cough interfering with sleep two time or less a month * Oral steroids no more than once a year * No hospitalizations  Seasonal and perennial allergic rhinitis (dust mites, dog, grass, mold, trees, weeds, and ragweed) - Continue with Flonase two spray per nostril up to twice daily.  - We can send you to see ENT.  - We are going to do some lab work to look for an immune problem.  - We will obtain some screening labs to evaluate your immune system.  - Labs to evaluate the quantitative [facility]) aspects of your immune system: IgG/IgA/IgM, CBC with differential - Labs to evaluate the qualitative (HOW WELL THEY WORK) aspects of your immune system: CH50, Pneumococcal titers, Tetanus titers, Diphtheria titers - We may consider immunizations with Pneumovax and Tdap to challenge your immune system, and then obtain repeat titers in 4-6 weeks.   Hives (urticaria) - Take the lease amount of medications while remaining hive free Cetirizine (Zyrtec) 10mg  twice a day and famotidine (Pepcid) 20 mg twice a day. If no symptoms for 7-14 days then decrease to. Cetirizine (Zyrtec) 10mg  twice a day and famotidine (Pepcid) 20 mg once a day.  If no symptoms for 7-14 days then decrease to. Cetirizine (Zyrtec) 10mg  twice a day.  If no symptoms for 7-14 days then decrease to. Cetirizine (Zyrtec) 10mg  once a day. - Keep a detailed symptom journal including foods eaten, contact with allergens,  medications taken, weather changes.   Stinging insect allergy - Lab testing was negative when we checked in 2023.  - We can do skin testing if needed in the future.  Return in about 6 months (around 08/03/2023). You can have the follow up appointment with Dr. Dellis Anes or a Nurse Practicioner (our Nurse Practitioners are excellent and always have Physician oversight!).    Please inform us of any Emergency Department visits, hospitalizations, or changes in symptoms. Call us before going to the ED for breathing or allergy symptoms since we might be able to fit you in for a sick visit. Feel free to contact us anytime with any questions, problems, or concerns.  It was a pleasure to see you and your family again today!  Websites that have reliable patient information: 1. American Academy of Asthma, Allergy, and Immunology: www.aaaai.org 2. Food Allergy Research and Education (FARE): foodallergy.org 3. Mothers of Asthmatics: http://www.asthmacommunitynetwork.org 4. American College of Allergy, Asthma, and Immunology: www.acaai.org      "Like" [place] on Facebook and Instagram for our latest updates!      A healthy democracy works best when [employer] participate! Make sure you are registered to vote! If you have moved or changed any of your contact information, you will need to get this updated before voting! Scan the QR codes below to learn more!

## 2023-02-18 ENCOUNTER — Telehealth: Payer: Self-pay

## 2023-02-18 NOTE — Telephone Encounter (Signed)
-----   Message from Alfonse Spruce sent at 02/03/2023  9:05 AM EST ----- ENT referral placed.

## 2023-05-06 ENCOUNTER — Encounter: Payer: Self-pay | Admitting: Allergy & Immunology

## 2023-05-09 NOTE — Telephone Encounter (Signed)
 Patient's spouse called requesting his Xolair to be sent to [facility]. Patient's spouse states he is breaking out and needs the injection.

## 2023-05-10 ENCOUNTER — Telehealth: Payer: Self-pay | Admitting: *Deleted

## 2023-05-10 MED ORDER — PREDNISONE 10 MG PO TABS: ORAL_TABLET | ORAL | 0 refills | Status: DC

## 2023-05-10 NOTE — Telephone Encounter (Signed)
 Called and left a voicemail asking for a return call to inform.

## 2023-05-10 NOTE — Addendum Note (Signed)
 Addended by: Alfonse Spruce on: 05/10/2023 01:36 PM   Modules accepted: Orders

## 2023-05-10 NOTE — Telephone Encounter (Signed)
 I can send in some prednisone until the script is taken care of.  Malachi Bonds, MD [facility]

## 2023-05-10 NOTE — Telephone Encounter (Signed)
 Patients wife called back and was informed. Patients wife verbalized understanding.

## 2023-05-10 NOTE — Telephone Encounter (Signed)
 Patient's wife called and stated that the Xolair prescription has not been sent in to the [facility] and he is now broken out in hives and having a lot of difficulty. Can you please send in the prescription for him to receive the Xolair every 3 weeks? I did let her know that she can come in and pick up a sample until then to help with his current outbreak.

## 2023-05-12 ENCOUNTER — Other Ambulatory Visit: Payer: Self-pay | Admitting: *Deleted

## 2023-05-12 MED ORDER — XOLAIR 300 MG/2ML ~~LOC~~ SOSY: 300.0000 mg | PREFILLED_SYRINGE | SUBCUTANEOUS | 11 refills | Status: AC

## 2023-05-12 NOTE — Telephone Encounter (Signed)
 Rx sent to [facility]

## 2023-05-23 ENCOUNTER — Other Ambulatory Visit: Payer: Self-pay | Admitting: *Deleted

## 2023-05-23 MED ORDER — EPINEPHRINE 0.3 MG/0.3ML IJ SOAJ: 0.3000 mg | Freq: Once | INTRAMUSCULAR | 1 refills | Status: AC | PRN

## 2023-06-28 ENCOUNTER — Encounter: Payer: Self-pay | Admitting: Allergy & Immunology

## 2023-06-30 MED ORDER — HYDROXYZINE HCL 25 MG PO TABS: 25.0000 mg | ORAL_TABLET | Freq: Three times a day (TID) | ORAL | 5 refills | Status: AC | PRN

## 2023-08-03 ENCOUNTER — Ambulatory Visit: Payer: No Typology Code available for payment source | Admitting: Allergy & Immunology

## 2023-10-21 ENCOUNTER — Other Ambulatory Visit (HOSPITAL_COMMUNITY)

## 2023-10-21 NOTE — Progress Notes (Signed)
 Surgical Instructions   Your procedure is scheduled on Wednesday, September 10th, 2025. Report to [facility] Main Entrance A at 6:30 A.M., then check in with the Admitting office. Any questions or running late day of surgery: call [phone]  Questions prior to your surgery date: call [phone], Monday-Friday, 8am-4pm. If you experience any cold or flu symptoms such as cough, fever, chills, shortness of breath, etc. between now and your scheduled surgery, please notify us  at the above number.     Remember:  Do not eat after midnight the night before your surgery   You may drink clear liquids until 5:30 the morning of your surgery.   Clear liquids allowed are: Water , Non-Citrus Juices (without pulp), Carbonated Beverages, Clear Tea (no milk, honey, etc.), Black Coffee Only (NO MILK, CREAM OR POWDERED CREAMER of any kind), and Gatorade.    Take these medicines the morning of surgery with A SIP OF WATER : Albuterol  (Proventil ) Baclofen  (Lioresal ) Cetirizine  (Zyrtec ) Famotidine  (Pepcid ) Fluticasone  Propionate (Xhance ) Gabapentin  (Neurontin ) Montelukast  (Singulair ) Symbicort  Inhaler Topiramate  (Topamax )   May take these medicines IF NEEDED: Albuterol  (Ventolin  HFA) inhaler - bring with you on the day of surgery    One week prior to surgery, STOP taking any Aspirin  (unless otherwise instructed by your surgeon) Aleve , Naproxen , Ibuprofen , Motrin , Advil , Goody's, BC's, all herbal medications, fish oil, and non-prescription vitamins.                     Do NOT Smoke (Tobacco/Vaping) for 24 hours prior to your procedure.  If you use a CPAP at night, you may bring your mask/headgear for your overnight stay.   You will be asked to remove any contacts, glasses, piercing's, hearing aid's, dentures/partials prior to surgery. Please bring cases for these items if needed.    Patients discharged the day of surgery will not be allowed to drive home, and someone needs to stay with them  for 24 hours.  SURGICAL WAITING ROOM VISITATION Patients may have no more than 2 support people in the waiting area - these visitors may rotate.   Pre-op nurse will coordinate an appropriate time for 1 ADULT support person, who may not rotate, to accompany patient in pre-op.  Children under the age of 43 must have an adult with them who is not the patient and must remain in the main waiting area with an adult.  If the patient needs to stay at the hospital during part of their recovery, the visitor guidelines for inpatient rooms apply.  Please refer to the [facility] website for the visitor guidelines for any additional information.   If you received a COVID test during your pre-op visit  it is requested that you wear a mask when out in public, stay away from anyone that may not be feeling well and notify your surgeon if you develop symptoms. If you have been in contact with anyone that has tested positive in the last 10 days please notify you surgeon.      Pre-operative CHG Bathing Instructions   You can play a key role in reducing the risk of infection after surgery. Your skin needs to be as free of germs as possible. You can reduce the number of germs on your skin by washing with CHG (chlorhexidine  gluconate) soap before surgery. CHG is an antiseptic soap that kills germs and continues to kill germs even after washing.   DO NOT use if you have an allergy to chlorhexidine /CHG or antibacterial soaps. If your skin becomes  reddened or irritated, stop using the CHG and notify one of our RNs at [phone].              TAKE A SHOWER THE NIGHT BEFORE SURGERY AND THE DAY OF SURGERY    Please keep in mind the following:  DO NOT shave, including legs and underarms, 48 hours prior to surgery.   You may shave your face before/day of surgery.  Place clean sheets on your bed the night before surgery Use a clean washcloth (not used since being washed) for each shower. DO NOT sleep with pet's  night before surgery.  CHG Shower Instructions:  Wash your face and private area with normal soap. If you choose to wash your hair, wash first with your normal shampoo.  After you use shampoo/soap, rinse your hair and body thoroughly to remove shampoo/soap residue.  Turn the water  OFF and apply half the bottle of CHG soap to a CLEAN washcloth.  Apply CHG soap ONLY FROM YOUR NECK DOWN TO YOUR TOES (washing for 3-5 minutes)  DO NOT use CHG soap on face, private areas, open wounds, or sores.  Pay special attention to the area where your surgery is being performed.  If you are having back surgery, having someone wash your back for you may be helpful. Wait 2 minutes after CHG soap is applied, then you may rinse off the CHG soap.  Pat dry with a clean towel  Put on clean pajamas    Additional instructions for the day of surgery: DO NOT APPLY any lotions, deodorants, cologne, or perfumes.   Do not wear jewelry or makeup Do not wear nail polish, gel polish, artificial nails, or any other type of covering on natural nails (fingers and toes) Do not bring valuables to the hospital. [facility] is not responsible for valuables/personal belongings. Put on clean/comfortable clothes.  Please brush your teeth.  Ask your nurse before applying any prescription medications to the skin.

## 2023-10-22 NOTE — H&P (Addendum)
 HPI:   Troy Lyons is a 43 y.o. male who presents as a new Patient.   Referring Provider: No ref. provider found  Chief complaint: Sinus infections.  HPI: Long history of sinus problems. He had polyp surgery at [facility] about 10 years ago. He is followed by an allergist. He was on Dupixent  for about 6 months but it did not seem to help so he stopped it. He is currently on immunotherapy. He has recurring sinus infections and chronic congestion and discharge. He suffers with asthma as an adult. He had it as a child and then it returned in his 30s. He drinks over a pot of coffee daily. He does suffer with some reflux symptoms such as heartburn. He is not allergic to aspirin . He has been on a lot of different allergy medicines which have not really helped. He is a chronic Q-tip user.  PMH/Meds/All/SocHx/FamHx/ROS:   Past Medical History:  Diagnosis Date  Asthma  Hyperlipidemia  MS (multiple sclerosis) (CMD)   History reviewed. No pertinent surgical history.  No family history of bleeding disorders, wound healing problems or difficulty with anesthesia.     Current Outpatient Medications:  albuterol  HFA (PROVENTIL  HFA;VENTOLIN  HFA;PROAIR  HFA) 90 mcg/actuation inhaler, Inhale 2 puffs every 6 (six) hours as needed., Disp: , Rfl:  ascorbic acid  (VITAMIN C ) 500 mg tablet, Take 500 mg by mouth daily., Disp: , Rfl:  atorvastatin (LIPITOR) 20 mg tablet, Take by mouth at bedtime., Disp: , Rfl:  baclofen  (LIORESAL ) 10 mg tablet, Take 10 mg by mouth 3 (three) times a day., Disp: , Rfl:  budesonide-formoteroL (Symbicort ) 160-4.5 mcg/actuation inhaler, Inhale 2 puffs daily., Disp: , Rfl:  cetirizine  (ZyrTEC ) 10 mg tablet, Take 10 mg by mouth daily., Disp: , Rfl:  cholecalciferol  (VITAMIN D3) 1,000 unit (25 mcg) tablet, Take 2,000 Units by mouth daily., Disp: , Rfl:  famotidine  (PEPCID ) 10 mg tablet, Take 1 tablet by mouth 2 (two) times a day., Disp: , Rfl:  fluticasone  propionate (FLONASE ) 50  mcg/spray nasal spray, Administer 1 spray into each nostril daily., Disp: , Rfl:  gabapentin  (NEURONTIN ) 600 mg tablet, Take 300 mg by mouth daily., Disp: , Rfl:  hydrOXYzine  (ATARAX ) 25 mg tablet, Take 25 mg by mouth., Disp: , Rfl:  montelukast  (SINGULAIR ) 10 mg tablet, Take 10 mg by mouth nightly., Disp: , Rfl:  natalizumab (TYSABRI) 300 mg/15 mL injection, Infuse 15 mL into a venous catheter once., Disp: , Rfl:  omalizumab  (XOLAIR ) 150 mg/mL Syringe, Inject 150 mg under the skin., Disp: , Rfl:  omega-3 fatty acids-fish oil (FISH OIL) 1,000 mg cap capsule, Take 1 capsule by mouth daily., Disp: , Rfl:  omeprazole  (PriLOSEC) 40 mg DR capsule, Take 40 mg by mouth., Disp: , Rfl:  polyethylene glycol (GLYCOLAX) 17 gram packet, Take by mouth., Disp: , Rfl:  rizatriptan MLT (MAXALT-MLT) 10 mg disintegrating tablet, Dissolve 10 mg on tongue., Disp: , Rfl:  topiramate  (TOPAMAX ) 50 mg tablet, Take 50 mg by mouth daily., Disp: , Rfl:   A complete ROS was performed with pertinent positives/negatives noted in the HPI. The remainder of the ROS are negative.   Physical Exam:   Temp 97.1 F (36.2 C) (Temporal)  Ht 1.905 m (6' 3)  Wt 134 kg (296 lb 6.4 oz)  BMI 37.05 kg/m   General: Healthy and alert, in no distress, breathing easily. Normal affect. In a pleasant mood. Head: Normocephalic, atraumatic. No masses, or scars. Eyes: Pupils are equal, and reactive to light. Vision is grossly intact. No spontaneous  or gaze nystagmus. Ears: Ear canals are completely occluded with cerumen. Both sides were cleaned out under the microscope. Tympanic membranes are intact, with normal landmarks and the middle ears are clear and healthy. Hearing: Grossly normal after the cleaning. Nose: Nasal cavities reveal bilateral polyposis arising from the middle meatus region without any exudate. Face: No masses or scars, facial nerve function is symmetric. Oral Cavity: No mucosal abnormalities are noted. Tongue with normal  mobility. Dentition appears healthy. Oropharynx: Tonsils are symmetric. There are no mucosal masses identified. Tongue base appears normal and healthy. Larynx/Hypopharynx: deferred Chest: Deferred Neck: No palpable masses, no cervical adenopathy, no thyroid  nodules or enlargement. Neuro: Cranial nerves II-XII with normal function. Balance: Normal gate. Other findings: none.  Independent Review of Additional Tests or Records:  none  Procedures:  none  Impression & Plans:  1. Bilateral cerumen impaction from excessive Q-tip use. Ears cleaned out and healthy today. Stop using Q-tips or anything else in the ears.  2. Chronic nasal polyposis. Recommend a steroid taper followed by CT imaging of the sinuses. He will likely require additional surgery.  3. Adult onset asthma could be reflux related. Recommend he cut back on coffee.

## 2023-10-24 ENCOUNTER — Other Ambulatory Visit: Payer: Self-pay

## 2023-10-24 ENCOUNTER — Encounter (HOSPITAL_COMMUNITY): Payer: Self-pay

## 2023-10-24 ENCOUNTER — Encounter (HOSPITAL_COMMUNITY)
Admission: RE | Admit: 2023-10-24 | Discharge: 2023-10-24 | Disposition: A | Discharge status: Home / Self Care | Source: Ambulatory Visit | Attending: Otolaryngology | Admitting: Otolaryngology

## 2023-10-24 VITALS — BP 128/90 | HR 78 | Temp 98.4°F | Resp 20 | Ht 75.0 in | Wt 291.1 lb

## 2023-10-24 DIAGNOSIS — I10 Essential (primary) hypertension: Secondary | ICD-10-CM | POA: Diagnosis not present

## 2023-10-24 DIAGNOSIS — G35 Multiple sclerosis: Secondary | ICD-10-CM | POA: Insufficient documentation

## 2023-10-24 DIAGNOSIS — Z01818 Encounter for other preprocedural examination: Secondary | ICD-10-CM | POA: Insufficient documentation

## 2023-10-24 DIAGNOSIS — J4540 Moderate persistent asthma, uncomplicated: Secondary | ICD-10-CM | POA: Diagnosis not present

## 2023-10-24 DIAGNOSIS — D696 Thrombocytopenia, unspecified: Secondary | ICD-10-CM | POA: Insufficient documentation

## 2023-10-24 DIAGNOSIS — G4733 Obstructive sleep apnea (adult) (pediatric): Secondary | ICD-10-CM | POA: Diagnosis not present

## 2023-10-24 HISTORY — DX: Essential (primary) hypertension: I10

## 2023-10-24 LAB — CBC
HCT: 45.9 % (ref 39.0–52.0)
Hemoglobin: 16 g/dL (ref 13.0–17.0)
MCH: 31.5 pg (ref 26.0–34.0)
MCHC: 34.9 g/dL (ref 30.0–36.0)
MCV: 90.4 fL (ref 80.0–100.0)
Platelets: 137 K/uL — ABNORMAL LOW (ref 150–400)
RBC: 5.08 MIL/uL (ref 4.22–5.81)
RDW: 12.8 % (ref 11.5–15.5)
WBC: 7 K/uL (ref 4.0–10.5)
nRBC: 0.7 % — ABNORMAL HIGH (ref 0.0–0.2)

## 2023-10-24 LAB — BASIC METABOLIC PANEL WITH GFR
Anion gap: 8 (ref 5–15)
BUN: 18 mg/dL (ref 6–20)
CO2: 24 mmol/L (ref 22–32)
Calcium: 9 mg/dL (ref 8.9–10.3)
Chloride: 107 mmol/L (ref 98–111)
Creatinine, Ser: 1.12 mg/dL (ref 0.61–1.24)
GFR, Estimated: >60 mL/min (ref >60)
Glucose, Bld: 102 mg/dL — ABNORMAL HIGH (ref 70–99)
Potassium: 4.2 mmol/L (ref 3.5–5.1)
Sodium: 139 mmol/L (ref 135–145)

## 2023-10-24 NOTE — Progress Notes (Signed)
 PCP - Jerilynn Carnes - [facility] Cardiologist - Denies  PPM/ICD - Denies Device Orders - N/A Rep Notified - N/A  Chest x-ray - N/A EKG - 10-24-23 Stress Test - Denies ECHO - Denies Cardiac Cath - Denies  Sleep Study - Yes,  CPAP - Per patient he moves in sleep and cannot tolerate CPAP; uses a mouth guard.  Non-diabetic  Last dose of GLP1 agonist-  Denies GLP1 instructions: N/A  Blood Thinner Instructions:Denies Aspirin  Instructions:Denies  ERAS Protcol - Clears until 53 PRE-SURGERY Ensure or G2- No  COVID TEST- N/A   Anesthesia review: Yes, HTN, MS  Patient denies shortness of breath, fever, cough and chest pain at PAT appointment. Patient denies any respiratory issues at this time.    All instructions explained to the patient, with a verbal understanding of the material. Patient agrees to go over the instructions while at home for a better understanding. Patient also instructed to self quarantine after being tested for COVID-19. The opportunity to ask questions was provided.

## 2023-10-25 NOTE — Anesthesia Preprocedure Evaluation (Signed)
 Anesthesia Evaluation  Patient identified by MRN, date of birth, ID band Patient awake    Reviewed: Allergy & Precautions, NPO status , Patient's Chart, lab work & pertinent test results  Airway Mallampati: II  TM Distance: >3 FB Neck ROM: Full    Dental  (+) Teeth Intact, Dental Advisory Given   Pulmonary asthma , sleep apnea    breath sounds clear to auscultation       Cardiovascular hypertension,  Rhythm:Regular Rate:Normal     Neuro/Psych  PSYCHIATRIC DISORDERS Anxiety Depression    negative neurological ROS     GI/Hepatic Neg liver ROS,GERD  Medicated,,  Endo/Other  negative endocrine ROS    Renal/GU negative Renal ROS     Musculoskeletal negative musculoskeletal ROS (+)    Abdominal   Peds  Hematology negative hematology ROS (+)   Anesthesia Other Findings   Reproductive/Obstetrics                              Anesthesia Physical Anesthesia Plan  ASA: 2  Anesthesia Plan: General   Post-op Pain Management: Tylenol  PO (pre-op)* and Toradol  IV (intra-op)*   Induction: Intravenous  PONV Risk Score and Plan: 3 and Ondansetron , Dexamethasone  and Midazolam   Airway Management Planned: Oral ETT  Additional Equipment: None  Intra-op Plan:   Post-operative Plan: Extubation in OR  Informed Consent: I have reviewed the patients History and Physical, chart, labs and discussed the procedure including the risks, benefits and alternatives for the proposed anesthesia with the patient or authorized representative who has indicated his/her understanding and acceptance.     Dental advisory given  Plan Discussed with: CRNA  Anesthesia Plan Comments: (PAT note by Troy Hope, PA-C: 43 year old male follows with neuro immunology clinic at [facility] for history of headaches, neuropathy, muscle spasms, relapsing remitting multiple sclerosis recently diagnosed 2013.  He is maintained on  natalizumab for management of multiple sclerosis.  Follows with allergy and asthma for history of moderate persistent asthma with eosinophilic phenotype, GERD, allergic rhinitis, recurrent pansinusitis.  He is maintained on Symbicort , albuterol , Xolair , famotidine , montelukast , cetirizine , fluticasone  propionate.  Other pertinent history includes HTN, OSA treated with oral appliance.  Preop labs reviewed, mild thrombocytopenia with platelets 137, otherwise unremarkable.  EKG 10/24/2023: NSR.  Rate 66.  )         Anesthesia Quick Evaluation

## 2023-10-25 NOTE — Progress Notes (Signed)
 Anesthesia Chart Review:  43 year old male follows with neuro immunology clinic at [facility] for history of headaches, neuropathy, muscle spasms, relapsing remitting multiple sclerosis recently diagnosed 2013.  He is maintained on natalizumab for management of multiple sclerosis.  Follows with allergy and asthma for history of moderate persistent asthma with eosinophilic phenotype, GERD, allergic rhinitis, recurrent pansinusitis.  He is maintained on Symbicort , albuterol , Xolair , famotidine , montelukast , cetirizine , fluticasone  propionate.  Other pertinent history includes HTN, OSA treated with oral appliance.  Preop labs reviewed, mild thrombocytopenia with platelets 137, otherwise unremarkable.  EKG 10/24/2023: NSR.  Rate 66.    Troy Lyons [facility] Short Stay Center/Anesthesiology Phone [phone] 10/25/2023 8:53 AM

## 2023-10-26 ENCOUNTER — Ambulatory Visit (HOSPITAL_COMMUNITY)
Admission: RE | Admit: 2023-10-26 | Discharge: 2023-10-26 | Disposition: A | Discharge status: Home / Self Care | Attending: Otolaryngology | Admitting: Otolaryngology

## 2023-10-26 ENCOUNTER — Encounter (HOSPITAL_COMMUNITY)
Admission: RE | Disposition: A | Discharge status: Home / Self Care | Payer: Self-pay | Source: Home / Self Care | Attending: Otolaryngology

## 2023-10-26 ENCOUNTER — Ambulatory Visit (HOSPITAL_COMMUNITY): Admitting: Physician Assistant

## 2023-10-26 ENCOUNTER — Other Ambulatory Visit: Payer: Self-pay

## 2023-10-26 ENCOUNTER — Ambulatory Visit (HOSPITAL_COMMUNITY): Admitting: Anesthesiology

## 2023-10-26 ENCOUNTER — Encounter (HOSPITAL_COMMUNITY): Payer: Self-pay | Admitting: Otolaryngology

## 2023-10-26 DIAGNOSIS — I10 Essential (primary) hypertension: Secondary | ICD-10-CM | POA: Diagnosis not present

## 2023-10-26 DIAGNOSIS — J339 Nasal polyp, unspecified: Secondary | ICD-10-CM

## 2023-10-26 DIAGNOSIS — J45909 Unspecified asthma, uncomplicated: Secondary | ICD-10-CM | POA: Insufficient documentation

## 2023-10-26 DIAGNOSIS — G35 Multiple sclerosis: Secondary | ICD-10-CM | POA: Diagnosis not present

## 2023-10-26 DIAGNOSIS — J324 Chronic pansinusitis: Secondary | ICD-10-CM | POA: Insufficient documentation

## 2023-10-26 DIAGNOSIS — G4730 Sleep apnea, unspecified: Secondary | ICD-10-CM | POA: Insufficient documentation

## 2023-10-26 DIAGNOSIS — K219 Gastro-esophageal reflux disease without esophagitis: Secondary | ICD-10-CM | POA: Diagnosis not present

## 2023-10-26 SURGERY — SINUS SURGERY, ENDOSCOPIC
Anesthesia: General | Site: Nose | Laterality: Bilateral

## 2023-10-26 MED ORDER — LIDOCAINE 2% (20 MG/ML) 5 ML SYRINGE
INTRAMUSCULAR | Status: AC
  Filled 2023-10-26: qty 5

## 2023-10-26 MED ORDER — PROPOFOL 10 MG/ML IV BOLUS
INTRAVENOUS | Status: DC | PRN
  Administered 2023-10-26: 200 mg via INTRAVENOUS
  Administered 2023-10-26: 40 mg via INTRAVENOUS

## 2023-10-26 MED ORDER — SUGAMMADEX SODIUM 200 MG/2ML IV SOLN
INTRAVENOUS | Status: DC | PRN
  Administered 2023-10-26: 200 mg via INTRAVENOUS

## 2023-10-26 MED ORDER — PROPOFOL 1000 MG/100ML IV EMUL
INTRAVENOUS | Status: AC
  Filled 2023-10-26: qty 100

## 2023-10-26 MED ORDER — ARTIFICIAL TEARS OPHTHALMIC OINT
TOPICAL_OINTMENT | OPHTHALMIC | Status: AC
  Filled 2023-10-26: qty 3.5

## 2023-10-26 MED ORDER — FENTANYL CITRATE (PF) 250 MCG/5ML IJ SOLN
INTRAMUSCULAR | Status: DC | PRN
  Administered 2023-10-26: 50 ug via INTRAVENOUS
  Administered 2023-10-26: 25 ug via INTRAVENOUS
  Administered 2023-10-26: 50 ug via INTRAVENOUS
  Administered 2023-10-26: 25 ug via INTRAVENOUS
  Administered 2023-10-26: 50 ug via INTRAVENOUS

## 2023-10-26 MED ORDER — PHENYLEPHRINE 80 MCG/ML (10ML) SYRINGE FOR IV PUSH (FOR BLOOD PRESSURE SUPPORT)
PREFILLED_SYRINGE | INTRAVENOUS | Status: AC
  Filled 2023-10-26: qty 10

## 2023-10-26 MED ORDER — ROCURONIUM BROMIDE 10 MG/ML (PF) SYRINGE
PREFILLED_SYRINGE | INTRAVENOUS | Status: DC | PRN
  Administered 2023-10-26: 50 mg via INTRAVENOUS

## 2023-10-26 MED ORDER — PHENYLEPHRINE HCL-NACL 20-0.9 MG/250ML-% IV SOLN
INTRAVENOUS | Status: AC
  Filled 2023-10-26: qty 250

## 2023-10-26 MED ORDER — PROPOFOL 10 MG/ML IV BOLUS
INTRAVENOUS | Status: AC
  Filled 2023-10-26: qty 20

## 2023-10-26 MED ORDER — HYDROCODONE-ACETAMINOPHEN 5-325 MG PO TABS: 1.0000 | ORAL_TABLET | Freq: Four times a day (QID) | ORAL | 0 refills | Status: AC | PRN

## 2023-10-26 MED ORDER — ACETAMINOPHEN 10 MG/ML IV SOLN: 1000.0000 mg | Freq: Once | INTRAVENOUS | Status: DC | PRN

## 2023-10-26 MED ORDER — LACTATED RINGERS IV SOLN
INTRAVENOUS | Status: DC
Start: 2023-10-26 — End: 2023-10-26

## 2023-10-26 MED ORDER — 0.9 % SODIUM CHLORIDE (POUR BTL) OPTIME
TOPICAL | Status: DC | PRN
  Administered 2023-10-26: 1000 mL

## 2023-10-26 MED ORDER — DROPERIDOL 2.5 MG/ML IJ SOLN: 0.6250 mg | Freq: Once | INTRAMUSCULAR | Status: DC | PRN

## 2023-10-26 MED ORDER — LIDOCAINE-EPINEPHRINE 1 %-1:100000 IJ SOLN
INTRAMUSCULAR | Status: DC | PRN
  Administered 2023-10-26: 16 mL

## 2023-10-26 MED ORDER — PHENYLEPHRINE 80 MCG/ML (10ML) SYRINGE FOR IV PUSH (FOR BLOOD PRESSURE SUPPORT)
PREFILLED_SYRINGE | INTRAVENOUS | Status: DC | PRN
Start: 2023-10-26 — End: 2023-10-26
  Administered 2023-10-26: 80 ug via INTRAVENOUS

## 2023-10-26 MED ORDER — ROCURONIUM BROMIDE 10 MG/ML (PF) SYRINGE
PREFILLED_SYRINGE | INTRAVENOUS | Status: AC
  Filled 2023-10-26: qty 10

## 2023-10-26 MED ORDER — OXYMETAZOLINE HCL 0.05 % NA SOLN
NASAL | Status: AC
  Filled 2023-10-26: qty 30

## 2023-10-26 MED ORDER — ORAL CARE MOUTH RINSE: 15.0000 mL | Freq: Once | OROMUCOSAL | Status: AC

## 2023-10-26 MED ORDER — BACITRACIN ZINC 500 UNIT/GM EX OINT
TOPICAL_OINTMENT | CUTANEOUS | Status: AC
  Filled 2023-10-26: qty 28.35

## 2023-10-26 MED ORDER — OXYCODONE HCL 5 MG/5ML PO SOLN: 5.0000 mg | Freq: Once | ORAL | Status: DC | PRN

## 2023-10-26 MED ORDER — DEXAMETHASONE SODIUM PHOSPHATE 10 MG/ML IJ SOLN
INTRAMUSCULAR | Status: AC
  Filled 2023-10-26: qty 1

## 2023-10-26 MED ORDER — MEPERIDINE HCL 25 MG/ML IJ SOLN
6.2500 mg | INTRAMUSCULAR | Status: DC | PRN
  Filled 2023-10-26: qty 1

## 2023-10-26 MED ORDER — OXYCODONE HCL 5 MG PO TABS: 5.0000 mg | ORAL_TABLET | Freq: Once | ORAL | Status: DC | PRN

## 2023-10-26 MED ORDER — OXYMETAZOLINE HCL 0.05 % NA SOLN
NASAL | Status: DC | PRN
  Administered 2023-10-26: 1 via TOPICAL

## 2023-10-26 MED ORDER — ONDANSETRON 4 MG PO TBDP: 4.0000 mg | ORAL_TABLET | Freq: Three times a day (TID) | ORAL | 0 refills | Status: AC | PRN

## 2023-10-26 MED ORDER — HYDROMORPHONE HCL 1 MG/ML IJ SOLN
INTRAMUSCULAR | Status: AC
  Filled 2023-10-26: qty 1

## 2023-10-26 MED ORDER — SODIUM CHLORIDE 0.9 % IR SOLN
Status: DC | PRN
  Administered 2023-10-26: 1

## 2023-10-26 MED ORDER — OXYMETAZOLINE HCL 0.05 % NA SOLN
2.0000 | NASAL | Status: DC
  Administered 2023-10-26: 2 via NASAL

## 2023-10-26 MED ORDER — HYDROMORPHONE HCL 1 MG/ML IJ SOLN: 0.2500 mg | INTRAMUSCULAR | Status: DC | PRN

## 2023-10-26 MED ORDER — FENTANYL CITRATE (PF) 250 MCG/5ML IJ SOLN
INTRAMUSCULAR | Status: AC
  Filled 2023-10-26: qty 5

## 2023-10-26 MED ORDER — MIDAZOLAM HCL 2 MG/2ML IJ SOLN
INTRAMUSCULAR | Status: DC | PRN
  Administered 2023-10-26: 2 mg via INTRAVENOUS

## 2023-10-26 MED ORDER — LACTATED RINGERS IV SOLN: INTRAVENOUS | Status: DC

## 2023-10-26 MED ORDER — DEXMEDETOMIDINE HCL IN NACL 80 MCG/20ML IV SOLN
INTRAVENOUS | Status: AC
  Filled 2023-10-26: qty 20

## 2023-10-26 MED ORDER — CHLORHEXIDINE GLUCONATE 0.12 % MT SOLN
15.0000 mL | Freq: Once | OROMUCOSAL | Status: AC
  Administered 2023-10-26: 15 mL via OROMUCOSAL
  Filled 2023-10-26: qty 15

## 2023-10-26 MED ORDER — LIDOCAINE 2% (20 MG/ML) 5 ML SYRINGE
INTRAMUSCULAR | Status: DC | PRN
  Administered 2023-10-26: 100 mg via INTRAVENOUS

## 2023-10-26 MED ORDER — LIDOCAINE-EPINEPHRINE 1 %-1:100000 IJ SOLN
INTRAMUSCULAR | Status: AC
  Filled 2023-10-26: qty 1

## 2023-10-26 MED ORDER — ONDANSETRON HCL 4 MG/2ML IJ SOLN
INTRAMUSCULAR | Status: DC | PRN
  Administered 2023-10-26: 4 mg via INTRAVENOUS

## 2023-10-26 MED ORDER — DEXMEDETOMIDINE HCL IN NACL 80 MCG/20ML IV SOLN
INTRAVENOUS | Status: DC | PRN
  Administered 2023-10-26 (×2): 4 ug via INTRAVENOUS

## 2023-10-26 MED ORDER — DEXAMETHASONE SODIUM PHOSPHATE 10 MG/ML IJ SOLN
INTRAMUSCULAR | Status: DC | PRN
  Administered 2023-10-26: 10 mg via INTRAVENOUS

## 2023-10-26 MED ORDER — MIDAZOLAM HCL 2 MG/2ML IJ SOLN
INTRAMUSCULAR | Status: AC
Start: 2023-10-26 — End: 2023-10-26
  Filled 2023-10-26: qty 2

## 2023-10-26 MED ORDER — HYDROMORPHONE HCL 1 MG/ML IJ SOLN
0.2500 mg | INTRAMUSCULAR | Status: DC | PRN
  Administered 2023-10-26: 0.5 mg via INTRAVENOUS

## 2023-10-26 MED ORDER — ONDANSETRON HCL 4 MG/2ML IJ SOLN
INTRAMUSCULAR | Status: AC
  Filled 2023-10-26: qty 2

## 2023-10-26 SURGICAL SUPPLY — 20 items
BLADE TRICUT ROTATE M4 4 5PK (BLADE) ×1 IMPLANT
CANISTER SUCTION 3000ML PPV (SUCTIONS) ×1 IMPLANT
DRSG NASOPORE 8CM (GAUZE/BANDAGES/DRESSINGS) IMPLANT
FILTER ARTHROSCOPY CONVERTOR (FILTER) IMPLANT
GLOVE ECLIPSE 7.5 STRL STRAW (GLOVE) ×1 IMPLANT
GOWN STRL REUS W/ TWL LRG LVL3 (GOWN DISPOSABLE) ×2 IMPLANT
KIT BASIN OR (CUSTOM PROCEDURE TRAY) ×1 IMPLANT
KIT TURNOVER KIT B (KITS) ×1 IMPLANT
NDL PRECISIONGLIDE 27X1.5 (NEEDLE) ×1 IMPLANT
NEEDLE PRECISIONGLIDE 27X1.5 (NEEDLE) ×1 IMPLANT
NS IRRIG 1000ML POUR BTL (IV SOLUTION) ×1 IMPLANT
PAD ARMBOARD POSITIONER FOAM (MISCELLANEOUS) ×2 IMPLANT
PATTIES SURGICAL .5 X3 (DISPOSABLE) ×1 IMPLANT
SHEATH ENDOSCRUB 0 DEG (SHEATH) IMPLANT
SHEATH ENDOSCRUB 30 DEG (SHEATH) IMPLANT
SPECIMEN JAR SMALL (MISCELLANEOUS) ×1 IMPLANT
TOWEL GREEN STERILE FF (TOWEL DISPOSABLE) ×1 IMPLANT
TRAY ENT MC OR (CUSTOM PROCEDURE TRAY) ×1 IMPLANT
TUBE CONNECTING 12X1/4 (SUCTIONS) ×1 IMPLANT
WATER STERILE IRR 1000ML POUR (IV SOLUTION) ×1 IMPLANT

## 2023-10-26 NOTE — Transfer of Care (Signed)
 Immediate Anesthesia Transfer of Care Note  Patient: Troy Lyons  Procedure(s) Performed: SINUS SURGERY, ENDOSCOPIC (Bilateral: Nose)  Patient Location: PACU  Anesthesia Type:General  Level of Consciousness: awake, alert , and oriented  Airway & Oxygen Therapy: Patient Spontanous Breathing and Patient connected to face mask oxygen  Post-op Assessment: Report given to RN and Post -op Vital signs reviewed and stable  Post vital signs: Reviewed and stable  Last Vitals:  Vitals Value Taken Time  BP 134/89 10/26/23 10:06  Temp 36.4 C 10/26/23 10:05  Pulse 73 10/26/23 10:11  Resp 10 10/26/23 10:11  SpO2 96 % 10/26/23 10:11  Vitals shown include unfiled device data.  Last Pain:  Vitals:   10/26/23 1005  TempSrc:   PainSc: 0-No pain      Patients Stated Pain Goal: 0 (10/26/23 0722)  Complications: No notable events documented.

## 2023-10-26 NOTE — Anesthesia Postprocedure Evaluation (Signed)
 Anesthesia Post Note  Patient: Troy Lyons  Procedure(s) Performed: SINUS SURGERY, ENDOSCOPIC (Bilateral: Nose)     Patient location during evaluation: PACU Anesthesia Type: General Level of consciousness: awake and alert Pain management: pain level controlled Vital Signs Assessment: post-procedure vital signs reviewed and stable Respiratory status: spontaneous breathing, nonlabored ventilation, respiratory function stable and patient connected to nasal cannula oxygen Cardiovascular status: blood pressure returned to baseline and stable Postop Assessment: no apparent nausea or vomiting Anesthetic complications: no   No notable events documented.  Last Vitals:  Vitals:   10/26/23 1030 10/26/23 1045  BP: (!) 140/98 (!) 141/95  Pulse: 78 66  Resp: 17 11  Temp:    SpO2: 98% 95%    Last Pain:  Vitals:   10/26/23 1030  TempSrc:   PainSc: 2                  [name]

## 2023-10-26 NOTE — Anesthesia Procedure Notes (Addendum)
 Procedure Name: Intubation Date/Time: 10/26/2023 8:39 AM  Performed by: Worth Peppers, CRNAPre-anesthesia Checklist: Patient identified, Emergency Drugs available, Suction available and Patient being monitored Patient Re-evaluated:Patient Re-evaluated prior to induction Oxygen Delivery Method: Circle System Utilized Preoxygenation: Pre-oxygenation with 100% oxygen Induction Type: IV induction Ventilation: Mask ventilation without difficulty Laryngoscope Size: Mac and 3 Grade View: Grade I Tube type: Oral Number of attempts: 1 Airway Equipment and Method: Stylet and Oral airway Placement Confirmation: ETT inserted through vocal cords under direct vision, positive ETCO2 and breath sounds checked- equal and bilateral Secured at: 24 cm Tube secured with: Tape Dental Injury: Teeth and Oropharynx as per pre-operative assessment  Comments: Grade 1 view with head tilt.

## 2023-10-26 NOTE — Progress Notes (Signed)
 0.5mg  of dilaudid  wasted with Leontine, RN in stericycle.

## 2023-10-26 NOTE — Interval H&P Note (Signed)
 History and Physical Interval Note:  10/26/2023 8:20 AM  Troy Lyons  has presented today for surgery, with the diagnosis of Laryngopharyngeal reflux LPR Nasal polyposis.  The various methods of treatment have been discussed with the patient and family. After consideration of risks, benefits and other options for treatment, the patient has consented to  Procedure(s) with comments: SINUS SURGERY, ENDOSCOPIC (Bilateral) - Bilateral endoscopic polypectomy, ethmoid maxilary and frontal sinus surgery as a surgical intervention.  The patient's history has been reviewed, patient examined, no change in status, stable for surgery.  I have reviewed the patient's chart and labs.  Questions were answered to the patient's satisfaction.     Ida Loader

## 2023-10-26 NOTE — Discharge Instructions (Signed)
 Use saline nasal spray every hour while awake.  Avoid nose blowing.  Avoid any strenuous activity for 2 weeks.

## 2023-10-26 NOTE — Op Note (Signed)
 OPERATIVE REPORT  DATE OF SURGERY: 10/26/2023  PATIENT:  Troy Lyons,  43 y.o. male  PRE-OPERATIVE DIAGNOSIS: Chronic pansinusitis, Nasal polyposis  POST-OPERATIVE DIAGNOSIS: Chronic pansinusitis, Nasal polyposis  PROCEDURE:  Procedure(s): SINUS SURGERY, ENDOSCOPIC, bilateral ethmoid frontal and maxillary  SURGEON:  Ida VEAR Loader, MD  ASSISTANTS: None  ANESTHESIA:   General   EBL: 150 ml  DRAINS: None  LOCAL MEDICATIONS USED: 1% Xylocaine  with epinephrine   SPECIMEN: Bilateral sinus contents  COUNTS:  Correct  PROCEDURE DETAILS: The patient was taken to the operating room and placed on the operating table in the supine position. Following induction of general endotracheal anesthesia, patient was draped in a standard fashion for endoscopic sinus surgery.  Oxymetazoline  spray was used preoperatively in the nasal cavities.  1% Xylocaine  with epinephrine  was infiltrated into the superior and posterior attachments of the middle turbinates bilaterally.  There was extensive polypoid disease seen bilaterally emanating from the middle and superior meatus.  1.  Bilateral endoscopic nasal polypectomy.  Using a 0 degree nasal endoscope and microdebrider of the polypoid tissue that was identified in the nasal cavity was dissected and followed back towards the middle and superior meatus on both sides.  2.  Bilateral endoscopic total ethmoidectomy.  After the nasal polypectomy was completed the dissection was then continued up into the ethmoid cavities on both sides.  There is extensive polypoid disease with some inspissated and purulent secretions trapped in ethmoid cells bilaterally.  This was all cleared out.  All disease was taken down and ethmoid septations were removed using the right greater and angled forceps.  The fovea was kept intact superiorly on both sides.  The lamina papyracea was intact bilaterally laterally.  Ground lamella was removed exposing the posterior cells.  Middle  turbinates were preserved bilaterally.  On the right side there is a thick bony shelf running vertically separating the anterior ethmoid from the frontal recess.  This was kept intact due to the thickness of the bone to prevent possibility of CSF leak.  Both ethmoid cavities were packed with half of a nasal pore dressing.  3.  Bilateral endoscopic frontal sinus surgery.  After the ethmoid dissection was completed the frontal recess was inspected bilaterally.  There is diffuse polypoid disease present bilaterally.  This was cleaned out using angled forceps and giraffe forceps.  I was able to then pass a suction all the way up into the frontal sinus on both sides.  4.  Bilateral endoscopic maxillary antrostomy.  Polypoid disease was completely obstructing and filling the maxillary antrum bilaterally.  This was all cleaned out using the microdebrider and angled forceps.  The pharynx was suctioned blood and secretions under direct visualization.  Patient was then awakened extubated and transferred to recovery in stable condition.    PATIENT DISPOSITION:  To PACU, stable

## 2023-10-27 ENCOUNTER — Encounter (HOSPITAL_COMMUNITY): Payer: Self-pay | Admitting: Otolaryngology

## 2023-10-27 LAB — SURGICAL PATHOLOGY

## 2024-02-01 ENCOUNTER — Ambulatory Visit: Admitting: Family Medicine

## 2024-02-01 NOTE — Patient Instructions (Incomplete)
 Asthma - Lung testing looks great,  - We are not going to make any changes at this time. - Daily controller medication(s): Symbicort  160/4.52mcg two puffs twice daily with spacer - Prior to physical activity: albuterol  2 puffs 10-15 minutes before physical activity. - Rescue medications: albuterol  4 puffs every 4-6 hours as needed - Asthma control goals:  * Full participation in all desired activities (may need albuterol  before activity) * Albuterol  use two time or less a week on average (not counting use with activity) * Cough interfering with sleep two time or less a month * Oral steroids no more than once a year * No hospitalizations  Seasonal and perennial allergic rhinitis (dust mites, dog, grass, mold, trees, weeds, and ragweed) - Continue with Flonase  two spray per nostril up to twice daily.  - We can send you to see ENT.  - We are going to do some lab work to look for an immune problem.  - We will obtain some screening labs to evaluate your immune system.  - Labs to evaluate the quantitative [facility]) aspects of your immune system: IgG/IgA/IgM, CBC with differential - Labs to evaluate the qualitative (HOW WELL THEY WORK) aspects of your immune system: CH50, Pneumococcal titers, Tetanus titers, Diphtheria titers - We may consider immunizations with Pneumovax and Tdap to challenge your immune system, and then obtain repeat titers in 4-6 weeks.   Hives (urticaria) - Take the lease amount of medications while remaining hive free Cetirizine  (Zyrtec ) 10mg  twice a day and famotidine  (Pepcid ) 20 mg twice a day. If no symptoms for 7-14 days then decrease to Cetirizine  (Zyrtec ) 10mg  twice a day and famotidine  (Pepcid ) 20 mg once a day.  If no symptoms for 7-14 days then decrease to Cetirizine  (Zyrtec ) 10mg  twice a day.  If no symptoms for 7-14 days then decrease to Cetirizine  (Zyrtec ) 10mg  once a day. - Keep a detailed symptom journal including foods eaten, contact with allergens,  medications taken, weather changes.   Stinging insect allergy - Lab testing was negative when we checked in 2023.  - We can do skin testing if needed in the future.

## 2024-02-01 NOTE — Progress Notes (Deleted)
° °  [address] [place] [address] Dept: [phone]  FOLLOW UP NOTE  Patient ID: Troy Lyons, male    DOB: [birth year]  Age: 43 y.o. MRN: [id number] Date of Office Visit: 02/01/2024  Assessment  Chief Complaint: No chief complaint on file.  HPI Troy Lyons is a 43 year old male who presents to the clinic for follow-up visit.  He was last seen in this clinic on 02/02/2023 by Dr. Iva for evaluation of asthma, allergic rhinitis, recurrent infection, pansinusitis, penicillin allergy, rash, hives, and stinging insect allergy with negative testing.  His current problem list includes MS with monthly Tysabri infusions  Discussed the use of AI scribe software for clinical note transcription with the patient, who gave verbal consent to proceed.  History of Present Illness      Drug Allergies:  Allergies[1]  Physical Exam: There were no vitals taken for this visit.   Physical Exam  Diagnostics:    Assessment and Plan: No diagnosis found.  No orders of the defined types were placed in this encounter.   There are no Patient Instructions on file for this visit.  No follow-ups on file.    Thank you for the opportunity to care for this patient.  Please do not hesitate to contact me with questions.  Arlean Mutter, FNP [facility]          [1]  Allergies Allergen Reactions   Penicillins Anaphylaxis    Has patient had a PCN reaction causing immediate rash, facial/tongue/throat swelling, SOB or lightheadedness with hypotension: Yes Has patient had a PCN reaction causing severe rash involving mucus membranes or skin necrosis: No Has patient had a PCN reaction that required hospitalization No Has patient had a PCN reaction occurring within the last 10 years: No If all of the above answers are NO, then may proceed with Cephalosporin use.    Poison Sumac Extract Anaphylaxis    Severe blistering and breathing  problems   Carbamazepine Itching   Fingolimod Hives and Rash
